# Patient Record
Sex: Female | Born: 1995 | Race: Black or African American | Hispanic: No | Marital: Single | Smoking: Never smoker
Health system: Southern US, Community
[De-identification: ages and names within clinical notes are randomized; demographics above are authoritative.]

## PROBLEM LIST (undated history)

## (undated) ENCOUNTER — Inpatient Hospital Stay: Payer: Self-pay

## (undated) DIAGNOSIS — L309 Dermatitis, unspecified: Secondary | ICD-10-CM

---
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
## 2017-03-19 ENCOUNTER — Emergency Department
Admission: EM | Admit: 2017-03-19 | Discharge: 2017-03-19 | Disposition: A | Discharge status: Home / Self Care | Payer: Medicaid Other | Attending: Emergency Medicine | Admitting: Emergency Medicine

## 2017-03-19 DIAGNOSIS — J0110 Acute frontal sinusitis, unspecified: Secondary | ICD-10-CM | POA: Diagnosis not present

## 2017-03-19 DIAGNOSIS — R0981 Nasal congestion: Secondary | ICD-10-CM | POA: Diagnosis present

## 2017-03-19 MED ORDER — AMOXICILLIN-POT CLAVULANATE 875-125 MG PO TABS: 1.0000 | ORAL_TABLET | Freq: Two times a day (BID) | ORAL | 0 refills | Status: DC

## 2017-03-19 MED ORDER — IBUPROFEN 800 MG PO TABS
800.0000 mg | ORAL_TABLET | Freq: Once | ORAL | Status: AC
  Administered 2017-03-19: 800 mg via ORAL

## 2017-03-19 MED ORDER — CETIRIZINE HCL 10 MG PO TABS: 10.0000 mg | ORAL_TABLET | Freq: Every day | ORAL | 0 refills | Status: DC

## 2017-03-19 MED ORDER — IBUPROFEN 800 MG PO TABS
ORAL_TABLET | ORAL | Status: AC
  Administered 2017-03-19: 800 mg via ORAL
  Filled 2017-03-19: qty 1

## 2017-03-19 MED ORDER — FLUTICASONE PROPIONATE 50 MCG/ACT NA SUSP: 1.0000 | Freq: Two times a day (BID) | NASAL | 0 refills | Status: DC

## 2017-03-19 NOTE — ED Provider Notes (Signed)
[facility] Emergency Department Provider Note  ____________________________________________  Time seen: Approximately 10:51 PM  I have reviewed the triage vital signs and the nursing notes.   HISTORY  Chief Complaint Facial Pain and Headache    HPI Danielle Gibbs is a 21 y.o. female who presents emergency department complaining of sinus congestion, sinus pressure, frontal headache. Patient reports that she has problems with allergies but does not take any allergy medicine. Patient reports that these symptoms have been ongoing for the past 4 days. She is taken one aspirin which has alleviated symptoms somewhat but returned. Patient denies any visual changes, neck pain, fevers or chills, abdominal pain, chest pain, shortness of breath.   Past Medical History:  Diagnosis Date  . Eczema     There are no active problems to display for this patient.   No past surgical history on file.  Prior to Admission medications   Medication Sig Start Date End Date Taking? Authorizing Provider  amoxicillin-clavulanate (AUGMENTIN) 875-125 MG tablet Take 1 tablet by mouth 2 (two) times daily. 03/19/17   Delorise Royals Madyn Ivins, PA-C  butalbital-acetaminophen-caffeine (FIORICET, ESGIC) [phone] MG tablet Take 1-2 tablets by mouth every 6 (six) hours as needed for headache. 09/16/16   Evangeline Dakin, PA-C  cetirizine (ZYRTEC) 10 MG tablet Take 1 tablet (10 mg total) by mouth daily. 03/19/17   Delorise Royals Kemal Amores, PA-C  fluticasone (FLONASE) 50 MCG/ACT nasal spray Place 1 spray into both nostrils 2 (two) times daily. 03/19/17   Delorise Royals Raiden Haydu, PA-C  hydrOXYzine (ATARAX/VISTARIL) 10 MG tablet Take 1 tablet (10 mg total) by mouth 3 (three) times daily as needed for anxiety. 09/16/16   Charmayne Sheer Beers, PA-C  triamcinolone ointment (KENALOG) 0.1 % Apply 1 application topically 2 (two) times daily.    Historical Provider, MD    Allergies Patient has no known allergies.  No family  history on file.  Social History Social History  Substance Use Topics  . Smoking status: Never Smoker  . Smokeless tobacco: Not on file  . Alcohol use No     Review of Systems  Constitutional: No fever/chills Eyes: No visual changes. No discharge ENT: For nasal congestion and sinus pressure Cardiovascular: no chest pain. Respiratory: no cough. No SOB. Gastrointestinal: No abdominal pain.  No nausea, no vomiting.  No diarrhea.  No constipation. Musculoskeletal: Negative for musculoskeletal pain. Skin: Negative for rash, abrasions, lacerations, ecchymosis. Neurological: For "sinus headache" but no focal weakness or numbness. 10-point ROS otherwise negative.  ____________________________________________   PHYSICAL EXAM:  VITAL SIGNS: ED Triage Vitals [03/19/17 2226]  Enc Vitals Group     BP (!) 132/91     Pulse Rate 78     Resp 18     Temp 98.8 F (37.1 C)     Temp Source Oral     SpO2 100 %     Weight 115 lb (52.2 kg)     Height 5' 6.5" (1.689 m)     Head Circumference      Peak Flow      Pain Score 5     Pain Loc      Pain Edu?      Excl. in GC?      Constitutional: Alert and oriented. Well appearing and in no acute distress. Eyes: Conjunctivae are normal. PERRL. EOMI. Head: Atraumatic. ENT:      Ears: EACs and TMs are unremarkable bilaterally      Nose: Moderate nasal congestion. Turbinates are erythematous and  edematous. Patient is tender to percussion over the frontal sinuses greater on left than right.      Mouth/Throat: Mucous membranes are moist.  Neck: No stridor. Neck is supple with full range of motion Hematological/Lymphatic/Immunilogical: nontender, mobile, anterior cervical lymphadenopathy. Cardiovascular: Normal rate, regular rhythm. Normal S1 and S2.  Good peripheral circulation. Respiratory: Normal respiratory effort without tachypnea or retractions. Lungs CTAB. Good air entry to the bases with no decreased or absent breath  sounds. Musculoskeletal: Full range of motion to all extremities. No gross deformities appreciated. Neurologic:  Normal speech and language. No gross focal neurologic deficits are appreciated.  Skin:  Skin is warm, dry and intact. No rash noted. Psychiatric: Mood and affect are normal. Speech and behavior are normal. Patient exhibits appropriate insight and judgement.   ____________________________________________   LABS (all labs ordered are listed, but only abnormal results are displayed)  Labs Reviewed - No data to display ____________________________________________  EKG   ____________________________________________  RADIOLOGY  No results found.  ____________________________________________    PROCEDURES  Procedure(s) performed:    Procedures    Medications  ibuprofen (ADVIL,MOTRIN) tablet 800 mg (not administered)  ibuprofen (ADVIL,MOTRIN) 800 MG tablet (not administered)     ____________________________________________   INITIAL IMPRESSION / ASSESSMENT AND PLAN / ED COURSE  Pertinent labs & imaging results that were available during my care of the patient were reviewed by me and considered in my medical decision making (see chart for details).  Review of the [place] CSRS was performed in accordance of the [facility] prior to dispensing any controlled drugs.     Patient's diagnosis is consistent with acute frontal sinusitis.. Patient will be discharged home with prescriptions for Flonase, Zyrtec, Augmentin . Patient is to follow up with primary care as needed or otherwise directed. Patient is given ED precautions to return to the ED for any worsening or new symptoms.     ____________________________________________  FINAL CLINICAL IMPRESSION(S) / ED DIAGNOSES  Final diagnoses:  Acute non-recurrent frontal sinusitis      NEW MEDICATIONS STARTED DURING THIS VISIT:  New Prescriptions   AMOXICILLIN-CLAVULANATE (AUGMENTIN) 875-125 MG TABLET    Take 1  tablet by mouth 2 (two) times daily.   CETIRIZINE (ZYRTEC) 10 MG TABLET    Take 1 tablet (10 mg total) by mouth daily.   FLUTICASONE (FLONASE) 50 MCG/ACT NASAL SPRAY    Place 1 spray into both nostrils 2 (two) times daily.        This chart was dictated using voice recognition software/Dragon. Despite best efforts to proofread, errors can occur which can change the meaning. Any change was purely unintentional.    Racheal Patches, PA-C 03/19/17 1610    Merrily Brittle, MD 03/19/17 [phone]

## 2017-03-19 NOTE — ED Triage Notes (Signed)
Patient reports having a "sinus headache" and allergies acting up. For several days.

## 2017-03-19 NOTE — ED Notes (Signed)
Pt reports dinus headache.

## 2017-08-17 ENCOUNTER — Inpatient Hospital Stay
Admission: EM | Admit: 2017-08-17 | Discharge: 2017-08-17 | Disposition: A | Discharge status: Home / Self Care | Payer: Medicaid Other | Attending: Obstetrics and Gynecology | Admitting: Obstetrics and Gynecology

## 2017-08-17 ENCOUNTER — Inpatient Hospital Stay: Payer: Medicaid Other

## 2017-08-17 ENCOUNTER — Encounter: Payer: Self-pay | Admitting: *Deleted

## 2017-08-17 DIAGNOSIS — O0932 Supervision of pregnancy with insufficient antenatal care, second trimester: Secondary | ICD-10-CM

## 2017-08-17 DIAGNOSIS — O26892 Other specified pregnancy related conditions, second trimester: Secondary | ICD-10-CM | POA: Diagnosis not present

## 2017-08-17 DIAGNOSIS — Z3A20 20 weeks gestation of pregnancy: Secondary | ICD-10-CM | POA: Insufficient documentation

## 2017-08-17 DIAGNOSIS — R109 Unspecified abdominal pain: Secondary | ICD-10-CM | POA: Diagnosis present

## 2017-08-17 DIAGNOSIS — O209 Hemorrhage in early pregnancy, unspecified: Secondary | ICD-10-CM | POA: Diagnosis not present

## 2017-08-17 DIAGNOSIS — O26899 Other specified pregnancy related conditions, unspecified trimester: Secondary | ICD-10-CM

## 2017-08-17 DIAGNOSIS — N939 Abnormal uterine and vaginal bleeding, unspecified: Secondary | ICD-10-CM

## 2017-08-17 DIAGNOSIS — R198 Other specified symptoms and signs involving the digestive system and abdomen: Secondary | ICD-10-CM

## 2017-08-17 DIAGNOSIS — L309 Dermatitis, unspecified: Secondary | ICD-10-CM | POA: Insufficient documentation

## 2017-08-17 DIAGNOSIS — O9989 Other specified diseases and conditions complicating pregnancy, childbirth and the puerperium: Secondary | ICD-10-CM | POA: Diagnosis not present

## 2017-08-17 DIAGNOSIS — Z3687 Encounter for antenatal screening for uncertain dates: Secondary | ICD-10-CM

## 2017-08-17 LAB — DIFFERENTIAL
Basophils Absolute: 0 10*3/uL (ref 0–0.1)
Basophils Relative: 1 %
EOS PCT: 1 %
Eosinophils Absolute: 0.1 10*3/uL (ref 0–0.7)
LYMPHS ABS: 1.5 10*3/uL (ref 1.0–3.6)
LYMPHS PCT: 18 %
MONO ABS: 0.5 10*3/uL (ref 0.2–0.9)
Monocytes Relative: 6 %
NEUTROS ABS: 6.1 10*3/uL (ref 1.4–6.5)
Neutrophils Relative %: 74 %

## 2017-08-17 LAB — URINALYSIS, COMPLETE (UACMP) WITH MICROSCOPIC
Bacteria, UA: NONE SEEN
Bilirubin Urine: NEGATIVE
GLUCOSE, UA: NEGATIVE mg/dL
HGB URINE DIPSTICK: NEGATIVE
Ketones, ur: NEGATIVE mg/dL
LEUKOCYTES UA: NEGATIVE
Nitrite: NEGATIVE
PH: 6 (ref 5.0–8.0)
Protein, ur: 30 mg/dL — AB
Specific Gravity, Urine: 1.025 (ref 1.005–1.030)

## 2017-08-17 LAB — ABO/RH: ABO/RH(D): A POS

## 2017-08-17 LAB — CBC
HEMATOCRIT: 30.9 % — AB (ref 35.0–47.0)
HEMATOCRIT: 29.2 % — AB (ref 35.0–47.0)
HEMOGLOBIN: 10.6 g/dL — AB (ref 12.0–16.0)
Hemoglobin: 11.2 g/dL — ABNORMAL LOW (ref 12.0–16.0)
MCH: 33.6 pg (ref 26.0–34.0)
MCH: 33.6 pg (ref 26.0–34.0)
MCHC: 36.1 g/dL — AB (ref 32.0–36.0)
MCHC: 36.1 g/dL — ABNORMAL HIGH (ref 32.0–36.0)
MCV: 93 fL (ref 80.0–100.0)
MCV: 93.1 fL (ref 80.0–100.0)
Platelets: 303 10*3/uL (ref 150–440)
Platelets: 268 10*3/uL (ref 150–440)
RBC: 3.32 MIL/uL — ABNORMAL LOW (ref 3.80–5.20)
RBC: 3.14 MIL/uL — AB (ref 3.80–5.20)
RDW: 13.9 % (ref 11.5–14.5)
RDW: 13.7 % (ref 11.5–14.5)
WBC: 8.3 10*3/uL (ref 3.6–11.0)
WBC: 8.2 10*3/uL (ref 3.6–11.0)

## 2017-08-17 LAB — COMPREHENSIVE METABOLIC PANEL
ALBUMIN: 3.5 g/dL (ref 3.5–5.0)
ALT: 11 U/L — ABNORMAL LOW (ref 14–54)
AST: 21 U/L (ref 15–41)
Alkaline Phosphatase: 52 U/L (ref 38–126)
Anion gap: 7 (ref 5–15)
BUN: 8 mg/dL (ref 6–20)
CHLORIDE: 107 mmol/L (ref 101–111)
CO2: 23 mmol/L (ref 22–32)
Calcium: 9.2 mg/dL (ref 8.9–10.3)
Creatinine, Ser: 0.46 mg/dL (ref 0.44–1.00)
GFR calc Af Amer: >60 mL/min (ref >60)
GFR calc non Af Amer: >60 mL/min (ref >60)
GLUCOSE: 77 mg/dL (ref 65–99)
POTASSIUM: 3.3 mmol/L — AB (ref 3.5–5.1)
Sodium: 137 mmol/L (ref 135–145)
Total Bilirubin: 0.8 mg/dL (ref 0.3–1.2)
Total Protein: 7.3 g/dL (ref 6.5–8.1)

## 2017-08-17 LAB — LIPASE, BLOOD: Lipase: 22 U/L (ref 11–51)

## 2017-08-17 LAB — CHLAMYDIA/NGC RT PCR (ARMC ONLY)
CHLAMYDIA TR: DETECTED — AB
N gonorrhoeae: NOT DETECTED

## 2017-08-17 LAB — POCT PREGNANCY, URINE: Preg Test, Ur: POSITIVE — AB

## 2017-08-17 MED ORDER — AZITHROMYCIN 250 MG PO TABS: 500.0000 mg | ORAL_TABLET | Freq: Every day | ORAL | Status: DC

## 2017-08-17 MED ORDER — AZITHROMYCIN 500 MG PO TABS: ORAL_TABLET | ORAL | 0 refills | Status: DC

## 2017-08-17 NOTE — H&P (Signed)
Danielle Gibbs is a 21 y.o. female presenting for Abd pain and found out she is pregnant today. Pt has felt FM at intervals.  OB History    Gravida Para Term Preterm AB Living   1             SAB TAB Ectopic Multiple Live Births                 Past Medical History:  Diagnosis Date  . Eczema    History reviewed. No pertinent surgical history. Family History: family history is not on file. Social History:  reports that she has never smoked. She has never used smokeless tobacco. She reports that she does not drink alcohol or use drugs.     Maternal Diabetes: No labs to date Genetic Screening: To late Maternal Ultrasounds/Referrals: N/A Fetal Ultrasounds or other Referrals:  N/A Maternal Substance Abuse:  None Significant Maternal Medications:  none Significant Maternal Lab Results:  none Other Comments:    Review of Systems  Constitutional: Negative.   HENT: Negative.   Eyes: Negative.   Respiratory: Negative.   Cardiovascular: Negative.   Gastrointestinal: Negative.   Genitourinary: Negative.   Musculoskeletal: Negative.   Skin: Negative.   Neurological: Negative.   Endo/Heme/Allergies: Negative.   Psychiatric/Behavioral: Negative.    History   Blood pressure 126/75, pulse 98, temperature 98.5 F (36.9 C), temperature source Oral, resp. rate 16, height 5\' 7"  (1.702 m), weight 54.4 kg (120 lb), last menstrual period 07/21/2017, SpO2 100 %, unknown if currently breastfeeding. Exam Physical Exam  Prenatal labs: ABO, Rh: --/--/A POS (08/29 1628) Antibody:   Rubella:   RPR:    HBsAg:    HIV:    GBS:     Assessment/Plan: A:Suspected 20 week pregnancy 2. Vaginal bleeding intermittantly  P: Type and RH. Will give Rhogam if indicated. 2. Prenatal labs and arrange NOB care. 3. DC after pain resolves and labs done.     Sharee PimpleCaron W Jones 08/17/2017, 6:17 PM

## 2017-08-17 NOTE — ED Provider Notes (Signed)
[facility] Emergency Department Provider Note  ____________________________________________  Time seen: Approximately 4:25 PM  I have reviewed the triage vital signs and the nursing notes.   HISTORY  Chief Complaint Abdominal Pain   HPI Shamaya K Gosser is a 21 y.o. female no significant past medical history who presents for evaluation of abdominal pain. Patient reports on and off pain for 3 months. This episode started 2 days ago. She describes a dull constant pain located in the center of her abdomen and the left lower quadrant and radiating to her lower back. She has been having intermittent vaginal spotting for the last few weeks. Patient reports that she has taken several pregnancy tests at home and they're all negative. She has noted that her abdomen is distended. No fever, chills, nausea, vomiting, dysuria or hematuria. No prior abdominal surgeries. Pain is moderate in intensity.  Past Medical History:  Diagnosis Date  . Eczema     There are no active problems to display for this patient.   No past surgical history on file.  Prior to Admission medications   Medication Sig Start Date End Date Taking? Authorizing Provider  amoxicillin-clavulanate (AUGMENTIN) 875-125 MG tablet Take 1 tablet by mouth 2 (two) times daily. 03/19/17   Cuthriell, Delorise Royals, PA-C  butalbital-acetaminophen-caffeine (FIORICET, ESGIC) [phone] MG tablet Take 1-2 tablets by mouth every 6 (six) hours as needed for headache. 09/16/16   Beers, Charmayne Sheer, PA-C  cetirizine (ZYRTEC) 10 MG tablet Take 1 tablet (10 mg total) by mouth daily. 03/19/17   Cuthriell, Delorise Royals, PA-C  fluticasone (FLONASE) 50 MCG/ACT nasal spray Place 1 spray into both nostrils 2 (two) times daily. 03/19/17   Cuthriell, Delorise Royals, PA-C  hydrOXYzine (ATARAX/VISTARIL) 10 MG tablet Take 1 tablet (10 mg total) by mouth 3 (three) times daily as needed for anxiety. 09/16/16   Beers, Charmayne Sheer, PA-C  triamcinolone  ointment (KENALOG) 0.1 % Apply 1 application topically 2 (two) times daily.    [provider]    Allergies Patient has no known allergies.  No family history on file.  Social History Social History  Substance Use Topics  . Smoking status: Never Smoker  . Smokeless tobacco: Never Used  . Alcohol use No    Review of Systems  Constitutional: Negative for fever. Eyes: Negative for visual changes. ENT: Negative for sore throat. Neck: No neck pain  Cardiovascular: Negative for chest pain. Respiratory: Negative for shortness of breath. Gastrointestinal: + lower abdominal pain. No vomiting or diarrhea. Genitourinary: Negative for dysuria. + vaginal spotting Musculoskeletal: Negative for back pain. Skin: Negative for rash. Neurological: Negative for headaches, weakness or numbness. Psych: No SI or HI  ____________________________________________   PHYSICAL EXAM:  VITAL SIGNS: ED Triage Vitals  Enc Vitals Group     BP 08/17/17 1550 120/84     Pulse Rate 08/17/17 1550 98     Resp 08/17/17 1550 18     Temp 08/17/17 1550 99.1 F (37.3 C)     Temp Source 08/17/17 1550 Oral     SpO2 08/17/17 1550 99 %     Weight 08/17/17 1547 120 lb (54.4 kg)     Height 08/17/17 1547 5\' 7"  (1.702 m)     Head Circumference --      Peak Flow --      Pain Score 08/17/17 1547 5     Pain Loc --      Pain Edu? --      Excl. in GC? --  Constitutional: Alert and oriented. Well appearing and in no apparent distress. HEENT:      Head: Normocephalic and atraumatic.         Eyes: Conjunctivae are normal. Sclera is non-icteric.       Mouth/Throat: Mucous membranes are moist.       Neck: Supple with no signs of meningismus. Cardiovascular: Regular rate and rhythm. No murmurs, gallops, or rubs. 2+ symmetrical distal pulses are present in all extremities. No JVD. Respiratory: Normal respiratory effort. Lungs are clear to auscultation bilaterally. No wheezes, crackles, or rhonchi.    Gastrointestinal: Gravid with uterus palpable at the level of the umbilicus Musculoskeletal: Nontender with normal range of motion in all extremities. No edema, cyanosis, or erythema of extremities. Neurologic: Normal speech and language. Face is symmetric. Moving all extremities. No gross focal neurologic deficits are appreciated. Skin: Skin is warm, dry and intact. No rash noted. Psychiatric: Mood and affect are normal. Speech and behavior are normal.  ____________________________________________   LABS (all labs ordered are listed, but only abnormal results are displayed)  Labs Reviewed  COMPREHENSIVE METABOLIC PANEL - Abnormal; Notable for the following:       Result Value   Potassium 3.3 (*)    ALT 11 (*)    All other components within normal limits  CBC - Abnormal; Notable for the following:    RBC 3.32 (*)    Hemoglobin 11.2 (*)    HCT 30.9 (*)    MCHC 36.1 (*)    All other components within normal limits  URINALYSIS, COMPLETE (UACMP) WITH MICROSCOPIC - Abnormal; Notable for the following:    Color, Urine YELLOW (*)    APPearance CLEAR (*)    Protein, ur 30 (*)    Squamous Epithelial / LPF 0-5 (*)    All other components within normal limits  POCT PREGNANCY, URINE - Abnormal; Notable for the following:    Preg Test, Ur POSITIVE (*)    All other components within normal limits  LIPASE, BLOOD  POC URINE PREG, ED  ABO/RH   ____________________________________________  EKG  none ____________________________________________  RADIOLOGY  none  ____________________________________________   PROCEDURES  Procedure(s) performed:yes  Procedures   BEDSIDE US: Showing fully formed fetus, size late 2nd early 3rd trimester, with good fetal movement and FHR 150 Critical Care performed:  None ____________________________________________   INITIAL IMPRESSION / ASSESSMENT AND PLAN / ED COURSE  21 y.o. female no significant past medical history who presents for  evaluation of abdominal pain. Patient found to have advanced pregnancy in the ED. Spoke with Dr. Feliberto GottronSchermerhorn, OBGYN on call and patient will be sent to L&D to be monitored for active labor.     Pertinent labs & imaging results that were available during my care of the patient were reviewed by me and considered in my medical decision making (see chart for details).    ____________________________________________   FINAL CLINICAL IMPRESSION(S) / ED DIAGNOSES  Final diagnoses:  Abdominal pain during pregnancy, antepartum      NEW MEDICATIONS STARTED DURING THIS VISIT:  New Prescriptions   No medications on file     Note:  This document was prepared using Dragon voice recognition software and may include unintentional dictation errors.    Don PerkingVeronese, [place], MD 08/17/17 [phone]

## 2017-08-17 NOTE — ED Triage Notes (Signed)
Pt has left lower abd pain with intermittent vag bleeding.  No bleeding now   No urinary sx.  Pt has low back pain.  Pt reports vag discharge.  Pt alert.

## 2017-08-17 NOTE — H&P (Signed)
Danielle Gibbs is a 21 y.o. female presenting for abd pain to the ER and did not know she was pregnant. Pt had US which dated her at 5920 5/7 weeks today and EDD of 01/02/17. Pt admits she had felt FM but, was in denial. NO PNC to date. [place] lives in [place].  OB History    Gravida Para Term Preterm AB Living   1             SAB TAB Ectopic Multiple Live Births                 Past Medical History:  Diagnosis Date  . Eczema    History reviewed. No pertinent surgical history. Family History: family history is not on file. Social History:  reports that she has never smoked. She has never used smokeless tobacco. She reports that she does not drink alcohol or use drugs.     Maternal Diabetes: Not tested Genetic Screening: Not done Maternal Ultrasounds/Referrals: 1st US today for dating Fetal Ultrasounds or other Referrals: N/A Maternal Substance Abuse:  N/A Significant Maternal Medications: None Significant Maternal Lab Results:  A pos since pt had some bleeding at intervals in the preg Other Comments: Pt conversing and stable and feel she is able to be discharged and advised [facility] [place].   Review of Systems  Constitutional: Negative.   HENT: Negative.   Eyes: Negative.   Respiratory: Negative.   Cardiovascular: Negative.   Gastrointestinal: Negative.   Genitourinary: Negative.   Musculoskeletal: Negative.   Skin: Negative.   Neurological: Negative.   Endo/Heme/Allergies: Negative.   Psychiatric/Behavioral: Negative.    History   Blood pressure 120/70, pulse 86, temperature 98.3 F (36.8 C), temperature source Oral, resp. rate 16, height 5\' 7"  (1.702 m), weight 54.4 kg (120 lb), last menstrual period 07/21/2017, SpO2 100 %, unknown if currently breastfeeding. Exam Physical Exam  Prenatal labs: ABO, Rh: --/--/A POS (08/29 1628) Antibody:  drawn today Rubella:  drawn today RPR:   drawn today HBsAg:   drawn today HIV:   drawn today GBS:    Not done  GC:neg Chlamydia: positive after pt dc'd home. Assessment/Plan: A:1. IUP at 20 5/7 weeks 2. Pos Chlamydia resulted after dc home (will call and prescribe Azithromax) Partner needs testing.  P:1. [facility] [place] [facility] [place] [facility] 2. RN to track pt and advise of need for Azithromycin. 3. DC home  Sharee PimpleCaron W Jones 08/17/2017, 10:52 PM

## 2017-08-17 NOTE — Progress Notes (Signed)
Pt educated regarding need for follow-up prenatal care and to take prenatal vitamin. Discharge instructions reviewed, information provided regarding fetal growth and development and self-care during pregnancy. All questions and concerns addressed with pt, pt stated understanding. Pt ambulated self with escort to ER for discharge.

## 2017-08-17 NOTE — OB Triage Note (Signed)
Arrived to L&D from [facility]-ED with complaints of lower abdominal pain. Bedside ultrasound performed in ED confirming intrauterine pregnancy. Pt previously unaware of pregnancy. No leaking of fluids, but complains of occasional vaginal spotting.

## 2017-08-17 NOTE — Discharge Summary (Signed)
Obstetric Discharge Summary Reason for Admission: abd pain seen in  the ER today, [facility] Prenatal Procedures:None Intrapartum Procedures: N/A Postpartum Procedures: Not delivered  Complications-Operative and Postpartum: N/A Hemoglobin  Date Value Ref Range Status  08/17/2017 11.2 (L) 12.0 - 16.0 g/dL Final   HCT  Date Value Ref Range Status  08/17/2017 30.9 (L) 35.0 - 47.0 % Final    Physical Exam:  General: A,A&O x3 Heart: S1S2, RRR, No M/R/G Lungs:CTA bilat no W/R/R Uterus: FH is approx 20 cms  Incision: N/A DVT Evaluation: Neg Homans  Discharge Diagnoses: Discharge home  Discharge Information: Date: 08/17/2017 Activity: Up abd lib Diet: REgular diet Medications: PNV Condition:Stable  Instructions: Home to rest, FU for nOB care ASAP Discharge to: Home [place] in [place] and [facility] ADvised to start on PNV 1 po qd. Needs PNC ASAP. Pt did not know she was pregnant but, admits she was in denial. After pt left, the Chlamydia came in resulting as positive. Pt left before it was back. Will have nursing locate pt and it is prescribed.  Newborn Data: This patient has no babies on file. Danielle Gibbs.  Danielle Gibbs 08/17/2017, 6:21 PM

## 2017-08-17 NOTE — Discharge Instructions (Signed)
How a Baby Grows During Pregnancy Pregnancy begins when a female's sperm enters a female's egg (fertilization). This happens in one of the tubes (fallopian tubes) that connect the ovaries to the womb (uterus). The fertilized egg is called an embryo until it reaches 10 weeks. From 10 weeks until birth, it is called a fetus. The fertilized egg moves down the fallopian tube to the uterus. Then it implants into the lining of the uterus and begins to grow. The developing fetus receives oxygen and nutrients through the pregnant woman's bloodstream and the tissues that grow (placenta) to support the fetus. The placenta is the life support system for the fetus. It provides nutrition and removes waste. Learning as much as you can about your pregnancy and how your baby is developing can help you enjoy the experience. It can also make you aware of when there might be a problem and when to ask questions. How long does a typical pregnancy last? A pregnancy usually lasts 280 days, or about 40 weeks. Pregnancy is divided into three trimesters:  First trimester: 0-13 weeks.  Second trimester: 14-27 weeks.  Third trimester: 28-40 weeks.  The day when your baby is considered ready to be born (full term) is your estimated date of delivery. How does my baby develop month by month? First month  The fertilized egg attaches to the inside of the uterus.  Some cells will form the placenta. Others will form the fetus.  The arms, legs, brain, spinal cord, lungs, and heart begin to develop.  At the end of the first month, the heart begins to beat.  Second month  The bones, inner ear, eyelids, hands, and feet form.  The genitals develop.  By the end of 8 weeks, all major organs are developing.  Third month  All of the internal organs are forming.  Teeth develop below the gums.  Bones and muscles begin to grow. The spine can flex.  The skin is transparent.  Fingernails and toenails begin to  form.  Arms and legs continue to grow longer, and hands and feet develop.  The fetus is about 3 in (7.6 cm) long.  Fourth month  The placenta is completely formed.  The external sex organs, neck, outer ear, eyebrows, eyelids, and fingernails are formed.  The fetus can hear, swallow, and move its arms and legs.  The kidneys begin to produce urine.  The skin is covered with a white waxy coating (vernix) and very fine hair (lanugo).  Fifth month  The fetus moves around more and can be felt for the first time (quickening).  The fetus starts to sleep and wake up and may begin to suck its finger.  The nails grow to the end of the fingers.  The organ in the digestive system that makes bile (gallbladder) functions and helps to digest the nutrients.  If your baby is a girl, eggs are present in her ovaries. If your baby is a boy, testicles start to move down into his scrotum.  Sixth month  The lungs are formed, but the fetus is not yet able to breathe.  The eyes open. The brain continues to develop.  Your baby has fingerprints and toe prints. Your baby's hair grows thicker.  At the end of the second trimester, the fetus is about 9 in (22.9 cm) long.  Seventh month  The fetus kicks and stretches.  The eyes are developed enough to sense changes in light.  The hands can make a grasping motion.  The fetus responds to sound.  Eighth month  All organs and body systems are fully developed and functioning.  Bones harden and taste buds develop. The fetus may hiccup.  Certain areas of the brain are still developing. The skull remains soft.  Ninth month  The fetus gains about  lb (0.23 kg) each week.  The lungs are fully developed.  Patterns of sleep develop.  The fetus's head typically moves into a head-down position (vertex) in the uterus to prepare for birth. If the buttocks move into a vertex position instead, the baby is breech.  The fetus weighs 6-9 lbs  (2.72-4.08 kg) and is 19-20 in (48.26-50.8 cm) long.  What can I do to have a healthy pregnancy and help my baby develop? Eating and Drinking  Eat a healthy diet. ? Talk with your health care provider to make sure that you are getting the nutrients that you and your baby need. ? Visit www.DisposableNylon.be to learn about creating a healthy diet.  Gain a healthy amount of weight during pregnancy as advised by your health care provider. This is usually 25-35 pounds. You may need to: ? Gain more if you were underweight before getting pregnant or if you are pregnant with more than one baby. ? Gain less if you were overweight or obese when you got pregnant.  Medicines and Vitamins  Take prenatal vitamins as directed by your health care provider. These include vitamins such as folic acid, iron, calcium, and vitamin D. They are important for healthy development.  Take medicines only as directed by your health care provider. Read labels and ask a pharmacist or your health care provider whether over-the-counter medicines, supplements, and prescription drugs are safe to take during pregnancy.  Activities  Be physically active as advised by your health care provider. Ask your health care provider to recommend activities that are safe for you to do, such as walking or swimming.  Do not participate in strenuous or extreme sports.  Lifestyle  Do not drink alcohol.  Do not use any tobacco products, including cigarettes, chewing tobacco, or electronic cigarettes. If you need help quitting, ask your health care provider.  Do not use illegal drugs.  Safety  Avoid exposure to mercury, lead, or other heavy metals. Ask your health care provider about common sources of these heavy metals.  Avoid listeria infection during pregnancy. Follow these precautions: ? Do not eat soft cheeses or deli meats. ? Do not eat hot dogs unless they have been warmed up to the point of steaming, such as in the  microwave oven. ? Do not drink unpasteurized milk.  Avoid toxoplasmosis infection during pregnancy. Follow these precautions: ? Do not change your cat's litter box, if you have a cat. Ask someone else to do this for you. ? Wear gardening gloves while working in the yard.  General Instructions  Keep all follow-up visits as directed by your health care provider. This is important. This includes prenatal care and screening tests.  Manage any chronic health conditions. Work closely with your health care provider to keep conditions, such as diabetes, under control.  How do I know if my baby is developing well? At each prenatal visit, your health care provider will do several different tests to check on your health and keep track of your babys development. These include:  Fundal height. ? Your health care provider will measure your growing belly from top to bottom using a tape measure. ? Your health care provider will also feel your  belly to determine your baby's position.  Heartbeat. ? An ultrasound in the first trimester can confirm pregnancy and show a heartbeat, depending on how far along you are. ? Your health care provider will check your baby's heart rate at every prenatal visit. ? As you get closer to your delivery date, you may have regular fetal heart rate monitoring to make sure that your baby is not in distress.  Second trimester ultrasound. ? This ultrasound checks your baby's development. It also indicates your babys gender.  What should I do if I have concerns about my baby's development? Always talk with your health care provider about any concerns that you may have. This information is not intended to replace advice given to you by your health care provider. Make sure you discuss any questions you have with your health care provider. Document Released: 05/24/2008 Document Revised: 05/13/2016 Document Reviewed: 05/15/2014 Elsevier Interactive Patient Education  2018  [employer]. Prenatal Vitamin and Mineral Combinations (oral solid dosage forms) What is this medicine? PRENATAL VITAMIN AND MINERAL combinations are used before, during, and after pregnancy to help provide provide good nutrition. This medicine may be used for other purposes; ask your health care provider or pharmacist if you have questions. COMMON BRAND NAME(S): Active OB, Advanced Care Plus, Advanced NatalCare, Advanced-RF NatalCare, Aminate Fe, Anemagen OB, B-Nexa, BP FoliNatal Plus B, BP MultiNatal Plus, BP MultiNatal Plus Chewable, BP Prenate, Brainstrong, Bright Beginnings Prenatal, Cal-Nate, Calcium PNV, CareNatal DHA, CareNate 600, Cavan One Omega, Cavan Prenatal with EC Calcium, Cavan-Alpha, Cavan-EC SOD DHA, Cavan-Heme OB, Cavan-Heme Omega, Cenogen Ultra, Administrator, sports, CertaVite with Antioxidants, Choice-OB + DHA, Citracal Prenatal, Citracal Prenatal + DHA, CitraNatal 90 DHA, CitraNatal Assure, CitraNatal B-Calm, CitraNatal DHA, CitraNatal Harmony, CitraNatal Rx, Classic Prenatal, ComBi Rx, Complete Natal DHA, Complete-RF, CompleteNate, Concept DHA, Concept OB, CoreNate-DHA, Corvite FE with Quatrefolic, CRNatal DHA, Daily Vitamin, Docosavit, Duet, Duet Chewable, Duet DHA, Duet DHA 400, Duet DHA 430ec, Duet DHA Balanced, Duet DHA Complete, Duet DHA Complete Gluten Free, Duet DHA EC, Duet DHA Ferrazone, EC Omega-3, DuoVit DHA, Edge OB, Elite OB, Elite OB with DHA, Elite-OB 400, Extra-Virt Plus, Femecal OB, Femecal OB Plus DHA, Ferrocite Plus, Focalgin 90 DHA, Focalgin [place], [place], [place] DHA, [address] One, [address] 3, [place] One with [place], [place] OB, Folivane-EC Calcium DHA NF, Foltabs 90 Plus DHA, Foltabs Prenatal, Foltabs Prenatal Plus DHA, Gesticare, Gesticare DHA, Gesticare DHA Delayed-Release, HemeNatal OB, HemeNatal OB + DHA, Hemocyte Plus, HIP Prenatal, ICAR Prenatal Rx, iNatal Advance, iNatal GT, iNatal Ultra, Infanate Balance, Infanate DHA, Infanate Plus,  Kolnatal, Lactocal-F, Levomefolate PNV, MACNATAL CN DHA, Marnatal-F, Marnatal-F Plus, Materna, Maxinate, [place] Prenatal, Mission Prenatal F.A., Mission Prenatal H.P., Mom's Choice Rx, Multi-Nate 30, Multi-Nate 30 DHA, Multi-Nate DHA Extra, Multifol Plus, Multivitamin With Minerals, MyNatal OB Prenatal, NataCaps, NataChew, NataFolic-OB, Natafort, Natal-V RX, NatalCare CFe 60, NatalCare GlossTabs, NatalCare PIC, [employer], [employer], [employer], [employer] Three, NATALVIRT 90 DHA, NATALVIRT CA, NataTab CFe, NataTab FA, Natatab Rx, Natelle, Natelle C, Natelle One, [employer] with DHA, Natelle Prefer, Natelle-ez, Navatab + DHA, Neevo, Neevo DHA, Nestabs, Nestabs ABC, Nestabs CBF, Nestabs DHA, Nestabs FA, Nestabs Rx, New Advanced Formula Prenatal Z, Nexa Plus, Nexa Select, Niferex-PN, Niferex-PN Forte, Niva-Plus, NovaNatal, NovaStart, Nu-Natal, NutraCare, Nutri-Tab OB, Nutri-Tab OB + DHA, Nutrinate, NutriSpire, O-Cal F.A., O-Cal Prenatal, OB Choice, OB Complete, OB Complete 400, OB Complete One, OB Complete Petite, OB Complete Premier, OB Complete with DHA, OB-Natal One, Obstetrix EC Prenatal, Obstetrix-100, Obtrex, Obtrex DHA, One-A-Day  Women's, One-A-Day Women's Prenatal, OptiNate, Paire OB Tablet Plus DHA, PNV OB + DHA, PNV Prenatal, PNV Prenatal Plus Multivitamin, PNV Tabs 29-1, PNV-DHA, PNV-DHA + Docusate, PNV-DHA Plus, PNV-First, PNV-Iron, PNV-OB with DHA, PNV-Omega, PNV-Select, PNV-Total with DHA, PNV-VP-U, PR [place] 400, PR [place] 400ec, PR [address] 430, PR [address] 430ec, PR [place] 440ec, PreCare, PreferaOB, PreferaOB + DHA, PreferaOB One, Premesis Rx, [address], [address] Plus, [address], [address], [place], [place], Prenaissance 90 DHA, Prenaissance Balance, Prenaissance DHA, Prenaissance Harmony DHA, Prenaissance Next, Prenaissance Plus, Prenaissance Promise, PrenaPlus, Prenat with Quatrefolic, PreNata Multivitamin with Iron, Prenatabs CBF, Prenatabs FA, Prenatabs OBN, Prenatabs RX,  Prenatal, Prenatal 1 Plus 1, Prenatal 19, Prenatal AD, Prenatal Formula 3, Prenatal H, Prenatal Low Iron, Prenatal MR 58 Fe, Prenatal MTR with Selenium, Prenatal Multivitamin + DHA 2, Prenatal Optima Advance, Prenatal Plus, Prenatal Plus Iron, Prenatal Plus Low Iron, Prenatal Rx with Beta Carotene, Prenatal S, Prenatal U, Prenatal Vitamin, PreNatal Vitamins Plus, Prenate Advance, Prenate AM with Quatrefolic, Prenate DHA, Prenate Elite, Prenate Enhance with [employer], Prenate Essential, Prenate GT, Prenate Mini, PreNate Plus, Prenate Restore with Quatrefolic, [address], Prenate Ultra, Prenavite, Prenavite Protein, PreNexa, [employer] Inc, PrePLUS, PreQue, PreTAB, Previte Rx, PrimaCare, [employer], PrimaCare ONE, Provida OB, PruEt DHA, PruEt DHAec, PureFe OB Plus, PureFe Plus, PureVit DualFe Plus, RE DualVit OB, RE DualVit Plus, RE OB + DHA, RE OB 90 + DHA, RE Prenatal, RE PreVit + DHA, RE-Nata 29, RE-Nata 29 OB, Reaphrim, Renate, Renate DHA, Renate DHA Extra, REocyte Plus, Right Step, Rovin-Nv, Rovin-Nv DHA, Rulavite, Se-Care, Se-Care Conceive, Se-Care Gesture, Se-Natal 19, Se-Natal 19 Chewable, Se-Natal 90, Se-Natal ONE, Se-Plete DHA, Se-Tan DHA, Se-Tan Plus, Select-OB, Select-OB + DHA, SetonET, SetonET-EC DHA, StrongStart, StrongStart Chewable Tablet, Stuart One, Stuart Prenatal + DHA, Stuartnatal Plus 3, Tandem DHA, Tandem OB, Tandem Plus, Taron A Prenatal Pack with DHA, Taron EC Calcium DHA Pack, Taron Prenatal with DHA, Taron-C DHA, Taron-EC Cal, Taron-Prex Prenatal with DHA, Thera [place] Complete, Thera [place] Core Nutrition, Thera [place] Lactation Support, Thera [address] OvaVite, Thera NIKE, [place], Thrivite, TL-Care DHA, TL-Select, TL-Select DHA, Tri Rx, TriAdvance, Kohl's, TriCare Prenatal DHA ONE, Trifera OB, Trimesis Rx, Trinatal GT, Navy Rx 1, Trinate, TriStart DHA, Triveen-Duo DHA, Triveen-PRx RNF, Triveen-Ten, Trust [employer], UltimateCare Advantage, Economist Combo,  Economist ONE, UltimateCare ONE NF, Coca Cola, VemaVite-PRx 2, Vena-Bal DHA, Venatal-FA, Verotin-BY, Verotin-GR, Vinacal, Vinacal B, Vinatal Forte, Vinate 90, Vinate AZ, Vinate AZ Extra, Vinate C, Vinate Calcium, Vinate Care, Vinate DHA, Vinate GT, Vinate IC, Vinate II, Vinate III, Vinate M Low Iron, Vinate One, Vinate PN, Vinate Ultra, Virt-Advance, Virt-C DHA, Virt-Care One, Virt-Nate, Virt-PN, Virt-PN DHA, Virt-PN Plus, Virt-Select, Virt-Vite GT, VirtPrex, Vitafol PN, Vitafol Ultra, Vitafol-Nano Prenatal, Vitafol-OB, Vitafol-OB + DHA, Vitafol-OB and DHA, Vitafol-One, VitaMed MD Plus Rx, VitaNatal OB Plus DHA, vitaPearl Prenatal, VitaPhil, VitaPhil + DHA, VitaPhil + DHA 90, VitaPhil AiDE, VitaSpire, Viva CT, VIVA DHA, Vol-Tab Rx, VP CH Ultra, VP-CH-PNV, VP-GGR-B6 Prenatal, VP-HEME One, VP-PNV-DHA, Zatean-CH, Zatean-Pn, Zatean-Pn DHA, Zatean-Pn Plus, Zingiber What should I tell my health care provider before I take this medicine? They need to know if you have any of these conditions: -bleeding or clotting disorder -history of anemia of any type -other chronic health condition -an unusual or allergic reaction to vitamins, minerals, other medicines, foods, dyes, or preservatives How should I use this medicine? Take this medicine by mouth with a glass of water. You can take it with or without food. If it upsets your stomach, take it with food. Chewable prenatal  vitamin tablets may be chewed completely before swallowing. Follow the directions on the prescription label. The usual dose is taken once a day. Do not take your medicine more often than directed. Contact your pediatrician regarding the use of this medicine in children. Special care may be needed. This medicine is intended for females who are pregnant, breast-feeding, or may become pregnant. Overdosage: If you think you have taken too much of this medicine contact a poison control center or emergency room at once. NOTE: This medicine is only  for you. Do not share this medicine with others. What if I miss a dose? If you miss a dose, take it as soon as you can. If it is almost time for your next dose, take only that dose. Do not take double or extra doses. What may interact with this medicine? -alendronate -antacids -cefdinir -cefditoren -etidronate -fluoroquinolone antibiotics (examples: ciprofloxacin, gatifloxacin, levofloxacin) -ibandronate -levodopa -risedronate -tetracycline antibiotics (examples: doxycycline, minocycline, tetracycline) -thyroid hormones -warfarin This list may not describe all possible interactions. Give your health care provider a list of all the medicines, herbs, non-prescription drugs, or dietary supplements you use. Also tell them if you smoke, drink alcohol, or use illegal drugs. Some items may interact with your medicine. What should I watch for while using this medicine? See your health care professional for regular checks on your progress. Remember that vitamin and mineral supplements do not replace the need for good nutrition from a balanced diet. Stools commonly change color when vitamins and minerals are taken. Notify your health care professional if this change is alarming or accompanied by other symptoms, like abdominal pain. What side effects may I notice from receiving this medicine? Side effects that you should report to your doctor or health care professional as soon as possible: -allergic reaction such as skin rash or difficulty breathing -vomiting Side effects that usually do not require medical attention (report to your doctor or health care professional if they continue or are bothersome): -nausea -stomach upset This list may not describe all possible side effects. Call your doctor for medical advice about side effects. You may report side effects to FDA at 1-800-FDA-1088. Where should I keep my medicine? Keep out of the reach of children. Most vitamins and minerals should be stored  at controlled room temperature. Check your specific product directions. Protect from heat and moisture. Throw away any unused medicine after the expiration date. NOTE: This sheet is a summary. It may not cover all possible information. If you have questions about this medicine, talk to your doctor, pharmacist, or health care provider.  2018 Elsevier/Gold Standard (2015-07-03 09:02:38)

## 2017-08-18 NOTE — Progress Notes (Signed)
Patient ID: Danielle Gibbs, female   DOB: Dec 11, 1996, 21 y.o.   MRN: [id number] Pt called and aware that she needs tx for chlamydia and her partner also. She needs a TOC in 3 weeks and advised "No sex" till cured. [facility]. Sharee Pimplearon W. Jones, RN, CNM, FNP

## 2017-08-19 LAB — HEPATITIS B SURFACE ANTIGEN: Hepatitis B Surface Ag: NEGATIVE

## 2017-08-19 LAB — SYPHILIS: RPR W/REFLEX TO RPR TITER AND TREPONEMAL ANTIBODIES, TRADITIONAL SCREENING AND DIAGNOSIS ALGORITHM: RPR Ser Ql: NONREACTIVE

## 2017-08-19 LAB — HIV ANTIBODY (ROUTINE TESTING W REFLEX): HIV Screen 4th Generation wRfx: NONREACTIVE

## 2017-08-19 LAB — RUBELLA SCREEN: Rubella: 4.09 index (ref >0.99)

## 2017-09-08 DIAGNOSIS — D649 Anemia, unspecified: Secondary | ICD-10-CM | POA: Insufficient documentation

## 2017-09-27 ENCOUNTER — Emergency Department
Admission: EM | Admit: 2017-09-27 | Discharge: 2017-09-28 | Disposition: A | Discharge status: Home / Self Care | Payer: Medicaid Other | Attending: Emergency Medicine | Admitting: Emergency Medicine

## 2017-09-27 ENCOUNTER — Encounter: Payer: Self-pay | Admitting: Emergency Medicine

## 2017-09-27 DIAGNOSIS — Z5321 Procedure and treatment not carried out due to patient leaving prior to being seen by health care provider: Secondary | ICD-10-CM | POA: Insufficient documentation

## 2017-09-27 DIAGNOSIS — R0602 Shortness of breath: Secondary | ICD-10-CM | POA: Diagnosis not present

## 2017-09-27 DIAGNOSIS — R079 Chest pain, unspecified: Secondary | ICD-10-CM | POA: Insufficient documentation

## 2017-09-27 LAB — BASIC METABOLIC PANEL
ANION GAP: 5 (ref 5–15)
BUN: 10 mg/dL (ref 6–20)
CALCIUM: 8.9 mg/dL (ref 8.9–10.3)
CO2: 26 mmol/L (ref 22–32)
CREATININE: 0.57 mg/dL (ref 0.44–1.00)
Chloride: 107 mmol/L (ref 101–111)
GFR calc Af Amer: >60 mL/min (ref >60)
GLUCOSE: 79 mg/dL (ref 65–99)
Potassium: 3.7 mmol/L (ref 3.5–5.1)
Sodium: 138 mmol/L (ref 135–145)

## 2017-09-27 LAB — CBC
HCT: 30.3 % — ABNORMAL LOW (ref 35.0–47.0)
HEMOGLOBIN: 10.2 g/dL — AB (ref 12.0–16.0)
MCH: 32.2 pg (ref 26.0–34.0)
MCHC: 33.8 g/dL (ref 32.0–36.0)
MCV: 95.4 fL (ref 80.0–100.0)
PLATELETS: 239 10*3/uL (ref 150–440)
RBC: 3.18 MIL/uL — ABNORMAL LOW (ref 3.80–5.20)
RDW: 14.5 % (ref 11.5–14.5)
WBC: 10 10*3/uL (ref 3.6–11.0)

## 2017-09-27 LAB — TROPONIN I

## 2017-09-27 LAB — HCG, QUANTITATIVE, PREGNANCY: hCG, Beta Chain, Quant, S: 3707 m[IU]/mL — ABNORMAL HIGH (ref <5)

## 2017-09-27 NOTE — ED Triage Notes (Addendum)
Patient states that she is [redacted] weeks pregnant and started feeling short of breath and having left side chest pain. Patient denies any cardiac or lung problems. Patient speaking in complete sentences in no acute distress at this time. Patient also reports that she has had decrease fetal movement today.

## 2017-09-28 NOTE — ED Notes (Signed)
Father to desk to inquire over wait time; updated on such; father st leaving now due to wait time; informed exam room should be available soon as there is approx one patient in front but father st pt has class in the morning and leaving now

## 2018-08-16 ENCOUNTER — Encounter (HOSPITAL_COMMUNITY): Payer: Self-pay

## 2018-12-16 ENCOUNTER — Encounter: Payer: Self-pay | Admitting: Internal Medicine

## 2018-12-16 ENCOUNTER — Other Ambulatory Visit: Payer: Self-pay

## 2018-12-16 ENCOUNTER — Emergency Department
Admission: EM | Admit: 2018-12-16 | Discharge: 2018-12-16 | Disposition: A | Discharge status: Home / Self Care | Payer: Medicaid Other | Attending: Emergency Medicine | Admitting: Emergency Medicine

## 2018-12-16 DIAGNOSIS — R0981 Nasal congestion: Secondary | ICD-10-CM | POA: Diagnosis not present

## 2018-12-16 DIAGNOSIS — J111 Influenza due to unidentified influenza virus with other respiratory manifestations: Secondary | ICD-10-CM | POA: Insufficient documentation

## 2018-12-16 DIAGNOSIS — R05 Cough: Secondary | ICD-10-CM | POA: Diagnosis not present

## 2018-12-16 DIAGNOSIS — R0989 Other specified symptoms and signs involving the circulatory and respiratory systems: Secondary | ICD-10-CM | POA: Diagnosis present

## 2018-12-16 LAB — INFLUENZA PANEL BY PCR (TYPE A & B)
Influenza A By PCR: NEGATIVE
Influenza B By PCR: POSITIVE — AB

## 2018-12-16 MED ORDER — OSELTAMIVIR PHOSPHATE 75 MG PO CAPS: 75.0000 mg | ORAL_CAPSULE | Freq: Two times a day (BID) | ORAL | 0 refills | Status: AC

## 2018-12-16 MED ORDER — GUAIFENESIN-CODEINE 100-10 MG/5ML PO SOLN: 5.0000 mL | Freq: Three times a day (TID) | ORAL | 0 refills | Status: DC | PRN

## 2018-12-16 MED ORDER — OSELTAMIVIR PHOSPHATE 75 MG PO CAPS: 75.0000 mg | ORAL_CAPSULE | Freq: Two times a day (BID) | ORAL | 0 refills | Status: DC

## 2018-12-16 NOTE — ED Triage Notes (Signed)
"  I think I have the flu."  Reports coughing and "heat flashes".

## 2018-12-16 NOTE — ED Provider Notes (Signed)
Danielle Gibbs ____________________________________________  Time seen: 2055  I have reviewed the triage vital signs and the nursing notes.  HISTORY  Chief Complaint  Influenza   HPI Danielle Gibbs is a 22 y.o. female presents to the ER today with complaint of runny nose, nasal congestion and chills.  She reports this started yesterday.  She is blowing clear mucus out of her nose.  She denies sore throat, ear pain.  She does have a slight nonproductive cough.  She denies shortness of breath or wheezing.  She is not sure if she is run fevers but has had chills and body aches.  She has tried Tylenol and Aleve with minimal relief.  She has no history of allergies or asthma.  She has had sick contacts diagnosed with the flu.  She did not get her flu shot.  Past Medical History:  Diagnosis Date  . Eczema     There are no active problems to display for this patient.   History reviewed. No pertinent surgical history.  Prior to Admission medications   Medication Sig Start Date End Date Taking? Authorizing Provider  guaiFENesin-codeine 100-10 MG/5ML syrup Take 5 mLs by mouth 3 (three) times daily as needed for cough. 12/16/18   Lorre MunroeBaity, Regina W, NP  oseltamivir (TAMIFLU) 75 MG capsule Take 1 capsule (75 mg total) by mouth 2 (two) times daily for 5 days. 12/16/18 12/21/18  Lorre MunroeBaity, Regina W, NP    Allergies Patient has no known allergies.  No family history on file.  Social History Social History   Tobacco Use  . Smoking status: Never Smoker  . Smokeless tobacco: Never Used  Substance Use Topics  . Alcohol use: No  . Drug use: No    Review of Systems  Constitutional: Positive for chills and body aches.  Negative for fever.. ENT: Positive for runny nose and nasal congestion.  Negative for ear pain or sore throat. Cardiovascular: Negative for chest pain. Respiratory: Stiffer cough negative for shortness of  breath. Gastrointestinal: Negative for nausea, vomiting and diarrhea. Skin: Negative for rash.  ____________________________________________  PHYSICAL EXAM:  VITAL SIGNS: ED Triage Vitals  Enc Vitals Group     BP 12/16/18 2016 121/89     Pulse Rate 12/16/18 2016 (!) 113     Resp 12/16/18 2016 18     Temp 12/16/18 2016 99.6 F (37.6 C)     Temp Source 12/16/18 2016 Oral     SpO2 12/16/18 2016 100 %     Weight 12/16/18 2017 130 lb (59 kg)     Height 12/16/18 2017 5\' 7"  (1.702 m)     Head Circumference --      Peak Flow --      Pain Score 12/16/18 2017 0     Pain Loc --      Pain Edu? --      Excl. in GC? --     Constitutional: Alert and oriented.  Appearing but in no distress. Ears: Canals clear. TMs intact bilaterally. Nose: Mucosa pink and moist.  Clear discharge noted in bilateral nare Mouth/Throat: Mucous membranes are moist.  No tonsillar swelling, exudate or erythema noted. Hematological/Lymphatic/Immunological: No cervical lymphadenopathy. Cardiovascular: Tachycardic with regular rhythm.  Respiratory: Normal respiratory effort. No wheezes/rales/rhonchi. ____________________________________________    LABS  Lab Orders     Influenza panel by PCR (type A & B)  ____________________________________________   INITIAL IMPRESSION / ASSESSMENT AND PLAN / ED COURSE  Runny Nose, Nasal Congestion,  Cough:  DDX include influenza, viral uri with cough, allergies Influenza positive for B RX for Tamiflu 75 mg BID x 5 days Can take Tylenol or Ibuprofen as needed for fever, body aches RX for Robitussin AC for cough ____________________________________________  FINAL CLINICAL IMPRESSION(S) / ED DIAGNOSES  Final diagnoses:  Influenza   Nicki Reaperegina Baity, NP    Lorre MunroeBaity, Regina W, NP 12/16/18 2111    Phineas SemenGoodman, Graydon, MD 12/16/18 2156

## 2018-12-16 NOTE — Discharge Instructions (Addendum)
Been diagnosed with the flu.  I am given you a prescription for Tamiflu and cough syrup.  You can take Tylenol or ibuprofen as needed for fever and body aches.

## 2018-12-16 NOTE — ED Triage Notes (Signed)
FIRST NURSE NOTE-here for possible flu. Pt given mask. NAD. Unlabored.

## 2019-04-02 DIAGNOSIS — Z113 Encounter for screening for infections with a predominantly sexual mode of transmission: Secondary | ICD-10-CM | POA: Diagnosis not present

## 2019-04-02 DIAGNOSIS — R300 Dysuria: Secondary | ICD-10-CM | POA: Diagnosis not present

## 2019-04-04 DIAGNOSIS — Z363 Encounter for antenatal screening for malformations: Secondary | ICD-10-CM | POA: Diagnosis not present

## 2019-04-11 DIAGNOSIS — Z3A21 21 weeks gestation of pregnancy: Secondary | ICD-10-CM | POA: Diagnosis not present

## 2019-04-11 DIAGNOSIS — Z3482 Encounter for supervision of other normal pregnancy, second trimester: Secondary | ICD-10-CM | POA: Diagnosis not present

## 2019-04-26 DIAGNOSIS — Z3402 Encounter for supervision of normal first pregnancy, second trimester: Secondary | ICD-10-CM | POA: Diagnosis not present

## 2019-04-26 DIAGNOSIS — M549 Dorsalgia, unspecified: Secondary | ICD-10-CM | POA: Diagnosis not present

## 2019-04-26 DIAGNOSIS — O9989 Other specified diseases and conditions complicating pregnancy, childbirth and the puerperium: Secondary | ICD-10-CM | POA: Diagnosis not present

## 2019-05-22 DIAGNOSIS — Z23 Encounter for immunization: Secondary | ICD-10-CM | POA: Diagnosis not present

## 2019-05-22 DIAGNOSIS — Z3402 Encounter for supervision of normal first pregnancy, second trimester: Secondary | ICD-10-CM | POA: Diagnosis not present

## 2019-06-07 DIAGNOSIS — F419 Anxiety disorder, unspecified: Secondary | ICD-10-CM | POA: Diagnosis not present

## 2019-06-07 DIAGNOSIS — O99340 Other mental disorders complicating pregnancy, unspecified trimester: Secondary | ICD-10-CM | POA: Diagnosis not present

## 2019-06-11 DIAGNOSIS — F419 Anxiety disorder, unspecified: Secondary | ICD-10-CM | POA: Diagnosis not present

## 2019-06-11 DIAGNOSIS — F4001 Agoraphobia with panic disorder: Secondary | ICD-10-CM | POA: Diagnosis not present

## 2019-07-31 DIAGNOSIS — Z3483 Encounter for supervision of other normal pregnancy, third trimester: Secondary | ICD-10-CM | POA: Diagnosis not present

## 2019-08-09 DIAGNOSIS — O26893 Other specified pregnancy related conditions, third trimester: Secondary | ICD-10-CM | POA: Diagnosis not present

## 2019-08-09 DIAGNOSIS — Z3A38 38 weeks gestation of pregnancy: Secondary | ICD-10-CM | POA: Diagnosis not present

## 2019-08-11 DIAGNOSIS — Z3A39 39 weeks gestation of pregnancy: Secondary | ICD-10-CM | POA: Diagnosis not present

## 2019-08-14 DIAGNOSIS — Z3A39 39 weeks gestation of pregnancy: Secondary | ICD-10-CM | POA: Diagnosis not present

## 2019-08-14 DIAGNOSIS — Z8619 Personal history of other infectious and parasitic diseases: Secondary | ICD-10-CM | POA: Diagnosis not present

## 2019-08-14 DIAGNOSIS — Z1159 Encounter for screening for other viral diseases: Secondary | ICD-10-CM | POA: Diagnosis not present

## 2019-09-03 DIAGNOSIS — H538 Other visual disturbances: Secondary | ICD-10-CM | POA: Diagnosis not present

## 2019-09-03 DIAGNOSIS — H53149 Visual discomfort, unspecified: Secondary | ICD-10-CM | POA: Diagnosis not present

## 2019-09-03 DIAGNOSIS — O9989 Other specified diseases and conditions complicating pregnancy, childbirth and the puerperium: Secondary | ICD-10-CM | POA: Diagnosis not present

## 2019-09-03 DIAGNOSIS — R51 Headache: Secondary | ICD-10-CM | POA: Diagnosis not present

## 2019-09-26 ENCOUNTER — Encounter (HOSPITAL_COMMUNITY): Payer: Self-pay

## 2019-09-26 ENCOUNTER — Ambulatory Visit (HOSPITAL_COMMUNITY)
Admission: EM | Admit: 2019-09-26 | Discharge: 2019-09-26 | Disposition: A | Discharge status: Home / Self Care | Payer: Medicaid Other | Attending: Emergency Medicine | Admitting: Emergency Medicine

## 2019-09-26 ENCOUNTER — Other Ambulatory Visit: Payer: Self-pay

## 2019-09-26 DIAGNOSIS — L0291 Cutaneous abscess, unspecified: Secondary | ICD-10-CM | POA: Diagnosis not present

## 2019-09-26 MED ORDER — AMOXICILLIN 500 MG PO CAPS: 500.0000 mg | ORAL_CAPSULE | Freq: Three times a day (TID) | ORAL | 0 refills | Status: DC

## 2019-09-26 NOTE — ED Triage Notes (Signed)
Pt. States theres a medium sized bump under right armpit with pain and inflamed. She states she has no hx of boils.

## 2019-09-26 NOTE — ED Triage Notes (Signed)
Patient presents to Urgent Care with complaints of abscess under right axilla since 2 days ago. Patient reports it is not open or draining.

## 2019-09-26 NOTE — ED Provider Notes (Signed)
[facility]    CSN: [id number] Arrival date & time: 09/26/19  1926      History   Chief Complaint Chief Complaint  Patient presents with  . Abscess    2 DAYS    HPI Danielle Gibbs is a 23 y.o. female.   Patient presents with abscess in her right axilla x2 days.  No drainage.  She denies fever or chills.  She denies numbness, weakness, or paresthesias in RUE.  No pertinent medical history.  No treatments attempted at home.  The history is provided by the patient.    Past Medical History:  Diagnosis Date  . Eczema     There are no active problems to display for this patient.   History reviewed. No pertinent surgical history.  OB History    Gravida  1   Para      Term      Preterm      AB      Living        SAB      TAB      Ectopic      Multiple      Live Births               Home Medications    Prior to Admission medications   Medication Sig Start Date End Date Taking? Authorizing Provider  amoxicillin (AMOXIL) 500 MG capsule Take 1 capsule (500 mg total) by mouth 3 (three) times daily. 09/26/19   Mickie Bail, NP  guaiFENesin-codeine 100-10 MG/5ML syrup Take 5 mLs by mouth 3 (three) times daily as needed for cough. 12/16/18   Lorre Munroe, NP    Family History Family History  Problem Relation Age of Onset  . Healthy Mother   . Healthy Father     Social History Social History   Tobacco Use  . Smoking status: Never Smoker  . Smokeless tobacco: Never Used  Substance Use Topics  . Alcohol use: No  . Drug use: No     Allergies   Patient has no known allergies.   Review of Systems Review of Systems  Constitutional: Negative for chills and fever.  HENT: Negative for ear pain and sore throat.   Eyes: Negative for pain and visual disturbance.  Respiratory: Negative for cough and shortness of breath.   Cardiovascular: Negative for chest pain and palpitations.  Gastrointestinal: Negative for abdominal pain and  vomiting.  Genitourinary: Negative for dysuria and hematuria.  Musculoskeletal: Negative for arthralgias and back pain.  Skin: Positive for wound. Negative for color change and rash.  Neurological: Negative for seizures and syncope.  All other systems reviewed and are negative.    Physical Exam Triage Vital Signs ED Triage Vitals  Enc Vitals Group     BP 09/26/19 1936 126/79     Pulse Rate 09/26/19 1936 71     Resp 09/26/19 1936 17     Temp 09/26/19 1936 97.6 F (36.4 C)     Temp Source 09/26/19 1936 Temporal     SpO2 09/26/19 1936 100 %     Weight --      Height --      Head Circumference --      Peak Flow --      Pain Score 09/26/19 1934 8     Pain Loc --      Pain Edu? --      Excl. in GC? --    No data found.  Updated  Vital Signs BP 124/82 (BP Location: Left Arm)   Pulse 78   Temp 97.6 F (36.4 C) (Temporal)   Resp 17   SpO2 100%   Breastfeeding Yes   Visual Acuity Right Eye Distance:   Left Eye Distance:   Bilateral Distance:    Right Eye Near:   Left Eye Near:    Bilateral Near:     Physical Exam Vitals signs and nursing note reviewed.  Constitutional:      General: She is not in acute distress.    Appearance: She is well-developed. She is not ill-appearing.  HENT:     Head: Normocephalic and atraumatic.  Eyes:     Conjunctiva/sclera: Conjunctivae normal.  Neck:     Musculoskeletal: Neck supple.  Cardiovascular:     Rate and Rhythm: Normal rate and regular rhythm.     Heart sounds: No murmur.  Pulmonary:     Effort: Pulmonary effort is normal. No respiratory distress.     Breath sounds: Normal breath sounds.  Abdominal:     Palpations: Abdomen is soft.     Tenderness: There is no abdominal tenderness. There is no guarding or rebound.  Skin:    General: Skin is warm and dry.     Findings: No erythema.     Comments: 2 x 1 cm firm abscess in right axilla. No drainage or erythema.    Neurological:     General: No focal deficit present.      Mental Status: She is alert and oriented to person, place, and time.     Sensory: No sensory deficit.     Motor: No weakness.      UC Treatments / Results  Labs (all labs ordered are listed, but only abnormal results are displayed) Labs Reviewed - No data to display  EKG   Radiology No results found.  Procedures Procedures (including critical care time)  Medications Ordered in UC Medications - No data to display  Initial Impression / Assessment and Plan / UC Course  I have reviewed the triage vital signs and the nursing notes.  Pertinent labs & imaging results that were available during my care of the patient were reviewed by me and considered in my medical decision making (see chart for details).    Abscess in right axilla.  Treating with amoxicillin (patient is breast-feeding).  Instructed patient to apply warm compresses and encourage abscess to drain.  Instructed her to return here or follow-up with her PCP or go to the ED if she develops increased pain, redness, swelling, fever, chills, or other signs of systemic infection.  Patient agrees with plan of care.     Final Clinical Impressions(s) / UC Diagnoses   Final diagnoses:  Abscess     Discharge Instructions     Take the antibiotic as prescribed.  Apply warm compresses to the area 2-3 times a day.  Encourage the abscess to drain.    Return here or follow-up with your primary care provider or go to the emergency department if you develop increased pain, redness, swelling, fever, chills, or other signs of infection.        ED Prescriptions    Medication Sig Dispense Auth. Provider   amoxicillin (AMOXIL) 500 MG capsule Take 1 capsule (500 mg total) by mouth 3 (three) times daily. 21 capsule Sharion Balloon, NP     PDMP not reviewed this encounter.   Sharion Balloon, NP 09/26/19 2015

## 2019-09-26 NOTE — Discharge Instructions (Addendum)
Take the antibiotic as prescribed.  Apply warm compresses to the area 2-3 times a day.  Encourage the abscess to drain.    Return here or follow-up with your primary care provider or go to the emergency department if you develop increased pain, redness, swelling, fever, chills, or other signs of infection.

## 2019-12-01 ENCOUNTER — Ambulatory Visit (HOSPITAL_COMMUNITY)
Admission: EM | Admit: 2019-12-01 | Discharge: 2019-12-01 | Disposition: A | Discharge status: Home / Self Care | Payer: Medicaid Other | Attending: Family Medicine | Admitting: Family Medicine

## 2019-12-01 ENCOUNTER — Other Ambulatory Visit: Payer: Self-pay

## 2019-12-01 ENCOUNTER — Encounter (HOSPITAL_COMMUNITY): Payer: Self-pay

## 2019-12-01 DIAGNOSIS — R300 Dysuria: Secondary | ICD-10-CM

## 2019-12-01 DIAGNOSIS — Z3202 Encounter for pregnancy test, result negative: Secondary | ICD-10-CM | POA: Diagnosis not present

## 2019-12-01 LAB — POCT PREGNANCY, URINE: Preg Test, Ur: NEGATIVE

## 2019-12-01 LAB — POC URINE PREG, ED: Preg Test, Ur: NEGATIVE

## 2019-12-01 MED ORDER — FLUCONAZOLE 150 MG PO TABS: 150.0000 mg | ORAL_TABLET | Freq: Every day | ORAL | 0 refills | Status: AC

## 2019-12-01 MED ORDER — NITROFURANTOIN MONOHYD MACRO 100 MG PO CAPS: 100.0000 mg | ORAL_CAPSULE | Freq: Two times a day (BID) | ORAL | 0 refills | Status: DC

## 2019-12-01 NOTE — ED Triage Notes (Signed)
Pt presents with pain with urination and vaginal itching X 3 days.

## 2019-12-01 NOTE — ED Provider Notes (Signed)
[place]    CSN: [id number] Arrival date & time: 12/01/19  1104      History   Chief Complaint Chief Complaint  Patient presents with  . Urinary Tract Infection    HPI Danielle Gibbs is a 23 y.o. female.   Patient here c/w dysuria x 3 days.  Admits vaginal itching, denies fever, chills, nausea, vomiting, diarrhea, abdominal pain, flank pain, vaginal discharge, hematuria. LMP now.  Sexually active, 1 female partner, with condoms, no known exposures to STDs.  No recent antibiotics use.     Past Medical History:  Diagnosis Date  . Eczema     There are no problems to display for this patient.   History reviewed. No pertinent surgical history.  OB History    Gravida  1   Para      Term      Preterm      AB      Living        SAB      TAB      Ectopic      Multiple      Live Births               Home Medications    Prior to Admission medications   Medication Sig Start Date End Date Taking? Authorizing Provider  amoxicillin (AMOXIL) 500 MG capsule Take 1 capsule (500 mg total) by mouth 3 (three) times daily. 09/26/19   Sharion Balloon, NP  fluconazole (DIFLUCAN) 150 MG tablet Take 1 tablet (150 mg total) by mouth daily for 1 day. 12/01/19 12/02/19  Peri Jefferson, PA-C  guaiFENesin-codeine 100-10 MG/5ML syrup Take 5 mLs by mouth 3 (three) times daily as needed for cough. 12/16/18   Jearld Fenton, NP  nitrofurantoin, macrocrystal-monohydrate, (MACROBID) 100 MG capsule Take 1 capsule (100 mg total) by mouth 2 (two) times daily. 12/01/19   Peri Jefferson, PA-C    Family History Family History  Problem Relation Age of Onset  . Healthy Mother   . Healthy Father     Social History Social History   Tobacco Use  . Smoking status: Never Smoker  . Smokeless tobacco: Never Used  Substance Use Topics  . Alcohol use: No  . Drug use: No     Allergies   Patient has no known allergies.   Review of Systems Review of Systems   Constitutional: Negative for chills, fatigue and fever.  Gastrointestinal: Negative for abdominal pain, diarrhea, nausea and vomiting.  Genitourinary: Positive for dysuria. Negative for flank pain, frequency, hematuria, menstrual problem, pelvic pain, urgency, vaginal bleeding, vaginal discharge and vaginal pain.  Skin: Negative for color change and rash.  Hematological: Negative for adenopathy.  Psychiatric/Behavioral: Negative for sleep disturbance.     Physical Exam Triage Vital Signs ED Triage Vitals  Enc Vitals Group     BP 12/01/19 1124 115/78     Pulse Rate 12/01/19 1124 71     Resp 12/01/19 1124 18     Temp 12/01/19 1124 98.3 F (36.8 C)     Temp Source 12/01/19 1124 Oral     SpO2 12/01/19 1124 98 %     Weight --      Height --      Head Circumference --      Peak Flow --      Pain Score 12/01/19 1125 7     Pain Loc --      Pain Edu? --  Excl. in GC? --    No data found.  Updated Vital Signs BP 115/78 (BP Location: Left Arm)   Pulse 71   Temp 98.3 F (36.8 C) (Oral)   Resp 18   LMP 11/26/2019   SpO2 98%   Visual Acuity Right Eye Distance:   Left Eye Distance:   Bilateral Distance:    Right Eye Near:   Left Eye Near:    Bilateral Near:     Physical Exam Vitals and nursing note reviewed.  Constitutional:      General: She is not in acute distress.    Appearance: Normal appearance. She is not toxic-appearing.  HENT:     Head: Normocephalic and atraumatic.     Nose: Nose normal.  Eyes:     General: No scleral icterus.    Extraocular Movements: Extraocular movements intact.     Conjunctiva/sclera: Conjunctivae normal.     Pupils: Pupils are equal, round, and reactive to light.  Cardiovascular:     Rate and Rhythm: Normal rate and regular rhythm.     Heart sounds: No murmur.  Pulmonary:     Effort: Pulmonary effort is normal. No respiratory distress.     Breath sounds: Normal breath sounds.  Abdominal:     General: Abdomen is flat. Bowel  sounds are normal. There is no distension.     Palpations: Abdomen is soft.     Tenderness: There is no abdominal tenderness. There is no right CVA tenderness, left CVA tenderness, guarding or rebound.  Musculoskeletal:        General: No deformity. Normal range of motion.     Cervical back: Normal range of motion.  Skin:    General: Skin is warm.     Capillary Refill: Capillary refill takes less than 2 seconds.     Findings: No lesion.  Neurological:     General: No focal deficit present.     Mental Status: She is alert and oriented to person, place, and time.  Psychiatric:        Mood and Affect: Mood normal.        Behavior: Behavior normal.      UC Treatments / Results  Labs (all labs ordered are listed, but only abnormal results are displayed) Labs Reviewed  POC URINE PREG, ED  POCT PREGNANCY, URINE    EKG   Radiology No results found.  Procedures Procedures (including critical care time)  Medications Ordered in UC Medications - No data to display  Initial Impression / Assessment and Plan / UC Course  I have reviewed the triage vital signs and the nursing notes.  Pertinent labs & imaging results that were available during my care of the patient were reviewed by me and considered in my medical decision making (see chart for details).     UA 1+ blood, 1+ leuk, 1+ protein, otherwise normal. Patient declined STD testing and testing of yeast infection at this time. If no improvement follow up with PCP. Final Clinical Impressions(s) / UC Diagnoses   Final diagnoses:  Dysuria     Discharge Instructions     Take medication as prescribed. Take diflucan after day 5 or 6 of being on antibiotics.    ED Prescriptions    Medication Sig Dispense Auth. Provider   fluconazole (DIFLUCAN) 150 MG tablet Take 1 tablet (150 mg total) by mouth daily for 1 day. 1 tablet Evern CoreLindquist, Jameyah Fennewald, PA-C   nitrofurantoin, macrocrystal-monohydrate, (MACROBID) 100 MG capsule Take 1  capsule (100 mg total)  by mouth 2 (two) times daily. 14 capsule Evern Core, PA-C     PDMP not reviewed this encounter.   Evern Core, PA-C 12/01/19 1231

## 2019-12-01 NOTE — Discharge Instructions (Signed)
Take medication as prescribed. Take diflucan after day 5 or 6 of being on antibiotics.

## 2019-12-03 ENCOUNTER — Encounter (HOSPITAL_COMMUNITY): Payer: Self-pay

## 2019-12-03 ENCOUNTER — Other Ambulatory Visit: Payer: Self-pay

## 2019-12-03 ENCOUNTER — Inpatient Hospital Stay
Admission: RE | Admit: 2019-12-03 | Discharge: 2019-12-03 | Disposition: A | Discharge status: Home / Self Care | Payer: 59 | Source: Ambulatory Visit

## 2019-12-03 ENCOUNTER — Ambulatory Visit (HOSPITAL_COMMUNITY)
Admission: EM | Admit: 2019-12-03 | Discharge: 2019-12-03 | Disposition: A | Discharge status: Home / Self Care | Payer: Medicaid Other | Attending: Family Medicine | Admitting: Family Medicine

## 2019-12-03 DIAGNOSIS — Z113 Encounter for screening for infections with a predominantly sexual mode of transmission: Secondary | ICD-10-CM | POA: Diagnosis not present

## 2019-12-03 DIAGNOSIS — N898 Other specified noninflammatory disorders of vagina: Secondary | ICD-10-CM | POA: Diagnosis present

## 2019-12-03 LAB — POCT URINALYSIS DIP (DEVICE)
Bilirubin Urine: NEGATIVE
Bilirubin Urine: NEGATIVE
Glucose, UA: NEGATIVE mg/dL
Glucose, UA: NEGATIVE mg/dL
Hgb urine dipstick: NEGATIVE
Ketones, ur: NEGATIVE mg/dL
Ketones, ur: NEGATIVE mg/dL
Nitrite: NEGATIVE
Nitrite: NEGATIVE
Protein, ur: 30 mg/dL — AB
Protein, ur: NEGATIVE mg/dL
Specific Gravity, Urine: 1.025 (ref 1.005–1.030)
Specific Gravity, Urine: 1.025 (ref 1.005–1.030)
Urobilinogen, UA: 0.2 mg/dL (ref 0.0–1.0)
Urobilinogen, UA: 0.2 mg/dL (ref 0.0–1.0)
pH: 6.5 (ref 5.0–8.0)
pH: 7 (ref 5.0–8.0)

## 2019-12-03 MED ORDER — METRONIDAZOLE 500 MG PO TABS
ORAL_TABLET | ORAL | Status: AC
  Filled 2019-12-03: qty 4

## 2019-12-03 MED ORDER — CEFTRIAXONE SODIUM 250 MG IJ SOLR
INTRAMUSCULAR | Status: AC
  Filled 2019-12-03: qty 250

## 2019-12-03 MED ORDER — METRONIDAZOLE 500 MG PO TABS
2000.0000 mg | ORAL_TABLET | Freq: Once | ORAL | Status: AC
  Administered 2019-12-03: 2000 mg via ORAL

## 2019-12-03 MED ORDER — AZITHROMYCIN 250 MG PO TABS
1000.0000 mg | ORAL_TABLET | Freq: Once | ORAL | Status: AC
  Administered 2019-12-03: 1000 mg via ORAL

## 2019-12-03 MED ORDER — CEFTRIAXONE SODIUM 250 MG IJ SOLR
250.0000 mg | Freq: Once | INTRAMUSCULAR | Status: AC
  Administered 2019-12-03: 250 mg via INTRAMUSCULAR

## 2019-12-03 MED ORDER — LIDOCAINE HCL 2 % IJ SOLN
INTRAMUSCULAR | Status: AC
  Filled 2019-12-03: qty 20

## 2019-12-03 MED ORDER — METRONIDAZOLE 500 MG PO TABS
ORAL_TABLET | ORAL | Status: AC
  Filled 2019-12-03: qty 1

## 2019-12-03 MED ORDER — AZITHROMYCIN 250 MG PO TABS
ORAL_TABLET | ORAL | Status: AC
  Filled 2019-12-03: qty 4

## 2019-12-03 NOTE — ED Triage Notes (Addendum)
Pt present vaginal discharge that is green/yellowish. Symptoms started yesterday. Pt would like a full STD panel .

## 2019-12-03 NOTE — ED Provider Notes (Signed)
[place]    CSN: [id number] Arrival date & time: 12/03/19  1410      History   Chief Complaint Chief Complaint  Patient presents with  . Vaginal Discharge    HPI Tiney K Bells is a 23 y.o. female.   Patient is a 23 year old female who presents today with green/yellowish vaginal discharge that started yesterday.  She was seen here approximate 2 days ago and treated for yeast infection and UTI.  She declined STD testing at that time.  She is here today requesting STD panel and treatment.  Denies any associated abdominal pain, back pain, fever, nausea or vomiting. Patient's last menstrual period was 11/26/2019.  ROS per HPI      Past Medical History:  Diagnosis Date  . Eczema     There are no problems to display for this patient.   History reviewed. No pertinent surgical history.  OB History    Gravida  1   Para      Term      Preterm      AB      Living        SAB      TAB      Ectopic      Multiple      Live Births               Home Medications    Prior to Admission medications   Medication Sig Start Date End Date Taking? Authorizing Provider  nitrofurantoin, macrocrystal-monohydrate, (MACROBID) 100 MG capsule Take 1 capsule (100 mg total) by mouth 2 (two) times daily. 12/01/19   Peri Jefferson, PA-C    Family History Family History  Problem Relation Age of Onset  . Healthy Mother   . Healthy Father     Social History Social History   Tobacco Use  . Smoking status: Never Smoker  . Smokeless tobacco: Never Used  Substance Use Topics  . Alcohol use: No  . Drug use: No     Allergies   Patient has no known allergies.   Review of Systems Review of Systems   Physical Exam Triage Vital Signs ED Triage Vitals  Enc Vitals Group     BP 12/03/19 1424 (!) 127/91     Pulse Rate 12/03/19 1424 68     Resp 12/03/19 1424 16     Temp 12/03/19 1424 98.4 F (36.9 C)     Temp Source 12/03/19 1424 Oral     SpO2  12/03/19 1424 100 %     Weight --      Height --      Head Circumference --      Peak Flow --      Pain Score 12/03/19 1427 0     Pain Loc --      Pain Edu? --      Excl. in [place]? --    No data found.  Updated Vital Signs BP (!) 127/91 (BP Location: Left Arm)   Pulse 68   Temp 98.4 F (36.9 C) (Oral)   Resp 16   LMP 11/26/2019   SpO2 100%   Visual Acuity Right Eye Distance:   Left Eye Distance:   Bilateral Distance:    Right Eye Near:   Left Eye Near:    Bilateral Near:     Physical Exam Vitals and nursing note reviewed.  Constitutional:      General: She is not in acute distress.    Appearance: Normal appearance.  She is not ill-appearing, toxic-appearing or diaphoretic.  HENT:     Head: Normocephalic.     Nose: Nose normal.     Mouth/Throat:     Pharynx: Oropharynx is clear.  Eyes:     Conjunctiva/sclera: Conjunctivae normal.  Pulmonary:     Effort: Pulmonary effort is normal.  Abdominal:     Palpations: Abdomen is soft.     Tenderness: There is no abdominal tenderness.  Musculoskeletal:        General: Normal range of motion.     Cervical back: Normal range of motion.  Skin:    General: Skin is warm and dry.     Findings: No rash.  Neurological:     Mental Status: She is alert.  Psychiatric:        Mood and Affect: Mood normal.      UC Treatments / Results  Labs (all labs ordered are listed, but only abnormal results are displayed) Labs Reviewed  CERVICOVAGINAL ANCILLARY ONLY    EKG   Radiology No results found.  Procedures Procedures (including critical care time)  Medications Ordered in UC Medications  cefTRIAXone (ROCEPHIN) injection 250 mg (has no administration in time range)  azithromycin (ZITHROMAX) tablet 1,000 mg (has no administration in time range)  metroNIDAZOLE (FLAGYL) tablet 2,000 mg (has no administration in time range)    Initial Impression / Assessment and Plan / UC Course  I have reviewed the triage vital signs  and the nursing notes.  Pertinent labs & imaging results that were available during my care of the patient were reviewed by me and considered in my medical decision making (see chart for details).     Vaginal discharge- treating for gonorrhea, chlamydia and trichomonas based on symptoms Swab sent for testing  safe sex practices  Final Clinical Impressions(s) / UC Diagnoses   Final diagnoses:  Vaginal discharge     Discharge Instructions     Treating you for gonorrhea, chlamydia and trichomoniasis today.  We will call you with any positive results of your swab.  Please refrain from sexual activity     ED Prescriptions    None     PDMP not reviewed this encounter.   Dahlia Byes A, NP 12/03/19 1500

## 2019-12-03 NOTE — Discharge Instructions (Addendum)
Treating you for gonorrhea, chlamydia and trichomoniasis today.  We will call you with any positive results of your swab.  Please refrain from sexual activity

## 2019-12-05 LAB — CERVICOVAGINAL ANCILLARY ONLY
Bacterial vaginitis: POSITIVE — AB
Candida vaginitis: POSITIVE — AB
Chlamydia: NEGATIVE
Neisseria Gonorrhea: NEGATIVE
Trichomonas: POSITIVE — AB

## 2019-12-06 ENCOUNTER — Telehealth: Payer: Self-pay | Admitting: Emergency Medicine

## 2019-12-06 MED ORDER — METRONIDAZOLE 0.75 % VA GEL: 1.0000 | Freq: Every day | VAGINAL | 0 refills | Status: AC

## 2019-12-06 NOTE — Telephone Encounter (Signed)
Trichomonas is positive. Rx metronidazole was given at the urgent care visit. Pt needs education to please refrain from sexual intercourse for 7 days to give the medicine time to work. Sexual partners need to be notified and tested/treated. Condoms may reduce risk of reinfection. Recheck for further evaluation if symptoms are not improving.   Bacterial vaginosis is positive. This was not treated at the urgent care visit.  metrogel no refills sent to patients pharmacy of choice.    Candida (yeast) is positive.  Prescription for fluconazole was given at the urgent care visit.    Patient contacted by phone and made aware of    results. Pt verbalized understanding and had all questions answered.

## 2020-01-16 ENCOUNTER — Encounter (HOSPITAL_COMMUNITY): Payer: Self-pay | Admitting: Emergency Medicine

## 2020-01-16 ENCOUNTER — Ambulatory Visit (HOSPITAL_COMMUNITY)
Admission: EM | Admit: 2020-01-16 | Discharge: 2020-01-16 | Disposition: A | Discharge status: Home / Self Care | Payer: Medicaid Other | Attending: Family Medicine | Admitting: Family Medicine

## 2020-01-16 ENCOUNTER — Other Ambulatory Visit: Payer: Self-pay

## 2020-01-16 DIAGNOSIS — L509 Urticaria, unspecified: Secondary | ICD-10-CM

## 2020-01-16 MED ORDER — PREDNISONE 10 MG (21) PO TBPK: ORAL_TABLET | Freq: Every day | ORAL | 0 refills | Status: DC

## 2020-01-16 NOTE — ED Triage Notes (Signed)
PT was here 12/14 and had multiple antibiotics. Since that visit she has had intermittent breakout of rashes. She has used benadryl, eczema cream, and cetaphil.

## 2020-01-16 NOTE — ED Provider Notes (Signed)
[facility]   [id number] 01/16/20 Arrival Time: 0940  ASSESSMENT & PLAN:  1. Urticaria     Will treat as hives given her description that fits. Unknown trigger.  Begin: Meds ordered this encounter  Medications  . predniSONE (STERAPRED UNI-PAK 21 TAB) 10 MG (21) TBPK tablet    Sig: Take by mouth daily. Take as directed.    Dispense:  21 tablet    Refill:  0    Benadryl as needed. Will follow up with PCP or here if worsening or failing to improve as anticipated. Reviewed expectations re: course of current medical issues. Questions answered. Outlined signs and symptoms indicating need for more acute intervention. Patient verbalized understanding. After Visit Summary given.   SUBJECTIVE:  Danielle Gibbs is a 24 y.o. female who presents with a skin complaint. H/O hives in distant past. For the past month or more reports almost daily itchy whelps of her skin; comes and goes; different locations. Benadryl with some help. OTC steroid cream helps. Has changed soap to cetaphil and feels this has helped. No resp or swallowing difficulties. Remembers taking prednisone in the past and would like to try again. No new exposures known. Otherwise well. Afebrile.  OBJECTIVE: Vitals:   01/16/20 1000  BP: 121/77  Pulse: 83  Temp: 98.1 F (36.7 C)  TempSrc: Oral  SpO2: 98%    General appearance: alert; no distress HEENT: [place]; AT Neck: supple with FROM Lungs: clear to auscultation bilaterally Heart: regular rate and rhythm Extremities: no edema; moves all extremities normally Skin: warm and dry; no current rash Psychological: alert and cooperative; normal mood and affect  No Known Allergies  Past Medical History:  Diagnosis Date  . Eczema    Social History   Socioeconomic History  . Marital status: Single    Spouse name: Not on file  . Number of children: Not on file  . Years of education: Not on file  . Highest education level: Not on file  Occupational History  .  Not on file  Tobacco Use  . Smoking status: Never Smoker  . Smokeless tobacco: Never Used  Substance and Sexual Activity  . Alcohol use: No  . Drug use: No  . Sexual activity: Not on file  Other Topics Concern  . Not on file  Social History Narrative  . Not on file   Social Determinants of Health   Financial Resource Strain:   . Difficulty of Paying Living Expenses: Not on file  Food Insecurity:   . Worried About Programme researcher, broadcasting/film/video in the Last Year: Not on file  . Ran Out of Food in the Last Year: Not on file  Transportation Needs:   . Lack of Transportation (Medical): Not on file  . Lack of Transportation (Non-Medical): Not on file  Physical Activity:   . Days of Exercise per Week: Not on file  . Minutes of Exercise per Session: Not on file  Stress:   . Feeling of Stress : Not on file  Social Connections:   . Frequency of Communication with Friends and Family: Not on file  . Frequency of Social Gatherings with Friends and Family: Not on file  . Attends Religious Services: Not on file  . Active Member of Clubs or Organizations: Not on file  . Attends Banker Meetings: Not on file  . Marital Status: Not on file  Intimate Partner Violence:   . Fear of Current or Ex-Partner: Not on file  . Emotionally Abused:  Not on file  . Physically Abused: Not on file  . Sexually Abused: Not on file   Family History  Problem Relation Age of Onset  . Healthy Mother   . Healthy Father    History reviewed. No pertinent surgical history.   Vanessa Kick, MD 01/16/20 1017

## 2020-02-04 ENCOUNTER — Emergency Department
Admission: EM | Admit: 2020-02-04 | Discharge: 2020-02-04 | Disposition: A | Discharge status: Home / Self Care | Payer: Medicaid Other | Attending: Emergency Medicine | Admitting: Emergency Medicine

## 2020-02-04 ENCOUNTER — Other Ambulatory Visit: Payer: Self-pay

## 2020-02-04 ENCOUNTER — Encounter: Payer: Self-pay | Admitting: Emergency Medicine

## 2020-02-04 DIAGNOSIS — B9689 Other specified bacterial agents as the cause of diseases classified elsewhere: Secondary | ICD-10-CM

## 2020-02-04 DIAGNOSIS — N760 Acute vaginitis: Secondary | ICD-10-CM | POA: Insufficient documentation

## 2020-02-04 DIAGNOSIS — N898 Other specified noninflammatory disorders of vagina: Secondary | ICD-10-CM | POA: Diagnosis present

## 2020-02-04 LAB — WET PREP, GENITAL
Sperm: NONE SEEN
Trich, Wet Prep: NONE SEEN
Yeast Wet Prep HPF POC: NONE SEEN

## 2020-02-04 LAB — CHLAMYDIA/NGC RT PCR (ARMC ONLY)??????????

## 2020-02-04 LAB — CHLAMYDIA/NGC RT PCR (ARMC ONLY)
Chlamydia Tr: NOT DETECTED
N gonorrhoeae: NOT DETECTED

## 2020-02-04 MED ORDER — METRONIDAZOLE 500 MG PO TABS: 500.0000 mg | ORAL_TABLET | Freq: Two times a day (BID) | ORAL | 0 refills | Status: DC

## 2020-02-04 NOTE — ED Notes (Signed)
See triage note  Presents with possible abscess area to left groin  States she noticed area about 1 week ago  But now area is larger

## 2020-02-04 NOTE — Discharge Instructions (Signed)
Follow-up with your primary care provider if any continued problems or concerns.  Begin taking the Flagyl 500 mg twice a day for the next 7 days.  Do not drink alcohol or mix this medication with cold medication containing alcohol as it will cause violent vomiting.  Increase fluids.

## 2020-02-04 NOTE — ED Provider Notes (Signed)
[facility] Emergency Department Provider Note  ____________________________________________   First MD Initiated Contact with Patient 02/04/20 1315     (approximate)  I have reviewed the triage vital signs and the nursing notes.   HISTORY  Chief Complaint Abscess   HPI Danielle Gibbs is a 24 y.o. female presents to the ED with complaint of possible abscess to her left groin area.  Patient states she noticed it 5 days ago and then it improved.  She states that it returned and she is worried that it is an abscess.  Patient currently is on her menstrual period and is unaware of any vaginal discharge.  She states there was some minor discharge prior to her menses and also that she has a different odor.  She rates her pain as an 8 out of 10.       Past Medical History:  Diagnosis Date  . Eczema     There are no problems to display for this patient.   History reviewed. No pertinent surgical history.  Prior to Admission medications   Medication Sig Start Date End Date Taking? Authorizing Provider  metroNIDAZOLE (FLAGYL) 500 MG tablet Take 1 tablet (500 mg total) by mouth 2 (two) times daily. 02/04/20   Johnn Hai, PA-C    Allergies Patient has no known allergies.  Family History  Problem Relation Age of Onset  . Healthy Mother   . Healthy Father     Social History Social History   Tobacco Use  . Smoking status: Never Smoker  . Smokeless tobacco: Never Used  Substance Use Topics  . Alcohol use: No  . Drug use: No    Review of Systems Constitutional: No fever/chills Cardiovascular: Denies chest pain. Respiratory: Denies shortness of breath. Gastrointestinal: No abdominal pain.  No nausea, no vomiting.  No diarrhea.  No constipation. Genitourinary: Negative for dysuria.  Positive vaginal odor.  Positive menses. Musculoskeletal: Negative for back pain. Skin: Negative for rash. Neurological: Negative for headaches, focal weakness or  numbness. ____________________________________________   PHYSICAL EXAM:  VITAL SIGNS: ED Triage Vitals  Enc Vitals Group     BP 02/04/20 1218 114/71     Pulse Rate 02/04/20 1218 (!) 109     Resp 02/04/20 1218 18     Temp 02/04/20 1218 98 F (36.7 C)     Temp Source 02/04/20 1218 Oral     SpO2 02/04/20 1218 97 %     Weight 02/04/20 1219 120 lb (54.4 kg)     Height 02/04/20 1219 5\' 7"  (1.702 m)     Head Circumference --      Peak Flow --      Pain Score 02/04/20 1257 8     Pain Loc --      Pain Edu? --      Excl. in [place]? --    Constitutional: Alert and oriented. Well appearing and in no acute distress. Eyes: Conjunctivae are normal.  Head: Atraumatic. Neck: No stridor.   Cardiovascular: Normal rate, regular rhythm. Grossly normal heart sounds.  Good peripheral circulation. Respiratory: Normal respiratory effort.  No retractions. Lungs CTAB. Gastrointestinal: Soft and nontender. No distention.  Genitourinary: There is an area of irritation on examination of external genitalia.  This area is at approximately 6 o'clock position.  Menstrual blood is present in the vaginal vault.  Wet prep and GC and gonorrhea cultures were obtained. Musculoskeletal: Moves upper and lower extremities with any difficulty.  Normal gait was noted. Neurologic:  Normal speech and language. No gross focal neurologic deficits are appreciated. No gait instability. Skin:  Skin is warm, dry and intact.  Psychiatric: Mood and affect are normal. Speech and behavior are normal.  ____________________________________________   LABS (all labs ordered are listed, but only abnormal results are displayed)  Labs Reviewed  WET PREP, GENITAL - Abnormal; Notable for the following components:      Result Value   Clue Cells Wet Prep HPF POC MANY (*)    WBC, Wet Prep HPF POC FEW (*)    All other components within normal limits  CHLAMYDIA/NGC RT PCR ([facility] ONLY)     PROCEDURES  Procedure(s) performed  (including Critical Care):  Procedures   ____________________________________________   INITIAL IMPRESSION / ASSESSMENT AND PLAN / ED COURSE  As part of my medical decision making, I reviewed the following data within the electronic MEDICAL RECORD NUMBER Notes from prior ED visits and [place] Controlled Substance Database  24 year old female presents to the ED with complaint of possible abscess to her left groin however on exam this appears to be a lymph node.  Patient states that this was noticed approximate 5 days ago and improved and then she noticed it again today.  She also complained of vaginal odor.  Wet prep shows clue cells suggestive of a bacterial vaginosis.  Patient was given a prescription for Flagyl 500 mg twice daily with food.  She is to follow-up with her PCP if any continued problems.  She was also made aware that her GC and Chlamydia cultures did not result today and should be available in the next 2 days.  ____________________________________________   FINAL CLINICAL IMPRESSION(S) / ED DIAGNOSES  Final diagnoses:  BV (bacterial vaginosis)     ED Discharge Orders         Ordered    metroNIDAZOLE (FLAGYL) 500 MG tablet  2 times daily     02/04/20 1604           Note:  This document was prepared using Dragon voice recognition software and may include unintentional dictation errors.    Tommi Rumps, PA-C 02/04/20 1614    Sharman Cheek, MD 02/05/20 [phone]

## 2020-02-04 NOTE — ED Triage Notes (Signed)
Patient presents to the ED with a "lump" near her vaginal that is causing her pain.  Patient states she noticed it last Wednesday but then it improved and then it came back worse.  Patient is in no obvious distress at this time.  Patient denies history of the same.

## 2020-02-07 ENCOUNTER — Other Ambulatory Visit: Payer: Self-pay

## 2020-02-07 ENCOUNTER — Ambulatory Visit (HOSPITAL_COMMUNITY)
Admission: EM | Admit: 2020-02-07 | Discharge: 2020-02-07 | Disposition: A | Discharge status: Home / Self Care | Payer: Medicaid Other | Attending: Family Medicine | Admitting: Family Medicine

## 2020-02-07 ENCOUNTER — Encounter (HOSPITAL_COMMUNITY): Payer: Self-pay | Admitting: Emergency Medicine

## 2020-02-07 DIAGNOSIS — N898 Other specified noninflammatory disorders of vagina: Secondary | ICD-10-CM | POA: Diagnosis present

## 2020-02-07 LAB — RAPID HIV SCREEN (HIV 1/2 AB+AG)
HIV 1/2 Antibodies: NONREACTIVE
HIV-1 P24 Antigen - HIV24: NONREACTIVE

## 2020-02-07 MED ORDER — NYSTATIN-TRIAMCINOLONE 100000-0.1 UNIT/GM-% EX CREA: TOPICAL_CREAM | CUTANEOUS | 0 refills | Status: DC

## 2020-02-07 MED ORDER — FLUCONAZOLE 150 MG PO TABS: 150.0000 mg | ORAL_TABLET | Freq: Every day | ORAL | 0 refills | Status: DC

## 2020-02-07 NOTE — Discharge Instructions (Addendum)
Treating you with what is most likely a yeast infection. Take the Diflucan 1 pill now and 1 pill in 3 days if you are still having symptoms.  Use the cream up to 2 times a day We will call you with any positive results of your swab or blood test.

## 2020-02-07 NOTE — ED Triage Notes (Addendum)
Patient has bumps on labia, reports itching, noticed today while showering.  Patient reports being seen at [facility]  (02/04/2020)and was tested for std, was told she has BV  Patient reports vaginal discharge, but menstrual cycle is ending  Patient maintained texting during entire in-take process

## 2020-02-08 LAB — RPR: RPR Ser Ql: NONREACTIVE

## 2020-02-09 NOTE — ED Provider Notes (Signed)
[facility]    CSN: [id number] Arrival date & time: 02/07/20  1428      History   Chief Complaint Chief Complaint  Patient presents with  . Rash    HPI Danielle Gibbs is a 24 y.o. female.   Pt is a 24 year old female that presents with discharge and rash to the vaginal area. This has been constant for the past few days.  Reporting some vaginal itching.  Was seen on 02/04/2020 and tested for STDs with negative results.  She was positive for BV.  Currently taking medication for BV treatment.  Denies any new sexual contact since testing.  Denies any fever, chills, abdominal pain, back pain, dysuria, hematuria or urinary frequency. Patient's last menstrual period was 01/16/2020.  ROS per HPI      Past Medical History:  Diagnosis Date  . Eczema     There are no problems to display for this patient.   History reviewed. No pertinent surgical history.  OB History    Gravida  1   Para      Term      Preterm      AB      Living        SAB      TAB      Ectopic      Multiple      Live Births               Home Medications    Prior to Admission medications   Medication Sig Start Date End Date Taking? Authorizing Provider  metroNIDAZOLE (FLAGYL) 500 MG tablet Take 1 tablet (500 mg total) by mouth 2 (two) times daily. 02/04/20  Yes Tommi Rumps, PA-C  fluconazole (DIFLUCAN) 150 MG tablet Take 1 tablet (150 mg total) by mouth daily. 02/07/20   Janace Aris, NP  nystatin-triamcinolone (MYCOLOG II) cream Apply to affected area daily 02/07/20   Janace Aris, NP    Family History Family History  Problem Relation Age of Onset  . Healthy Mother   . Healthy Father     Social History Social History   Tobacco Use  . Smoking status: Never Smoker  . Smokeless tobacco: Never Used  Substance Use Topics  . Alcohol use: No  . Drug use: No     Allergies   Patient has no known allergies.   Review of Systems Review of Systems   Physical  Exam Triage Vital Signs ED Triage Vitals  Enc Vitals Group     BP 02/07/20 1504 118/77     Pulse Rate 02/07/20 1504 79     Resp 02/07/20 1504 16     Temp 02/07/20 1504 98.6 F (37 C)     Temp Source 02/07/20 1504 Oral     SpO2 02/07/20 1504 100 %     Weight --      Height --      Head Circumference --      Peak Flow --      Pain Score 02/07/20 1501 10     Pain Loc --      Pain Edu? --      Excl. in [place]? --    No data found.  Updated Vital Signs BP 118/77 (BP Location: Left Arm)   Pulse 79   Temp 98.6 F (37 C) (Oral)   Resp 16   LMP 01/16/2020   SpO2 100%   Visual Acuity Right Eye Distance:   Left  Eye Distance:   Bilateral Distance:    Right Eye Near:   Left Eye Near:    Bilateral Near:     Physical Exam Vitals and nursing note reviewed.  Constitutional:      General: She is not in acute distress.    Appearance: Normal appearance. She is not ill-appearing, toxic-appearing or diaphoretic.  HENT:     Head: Normocephalic.     Nose: Nose normal.  Eyes:     Conjunctiva/sclera: Conjunctivae normal.  Pulmonary:     Effort: Pulmonary effort is normal.  Genitourinary:    Comments: erythematous vulva with yeast  No obvious lesions. Was able to wipe off yeast with gauze Musculoskeletal:        General: Normal range of motion.     Cervical back: Normal range of motion.  Skin:    General: Skin is warm and dry.     Findings: No rash.  Neurological:     Mental Status: She is alert.  Psychiatric:        Mood and Affect: Mood normal.      UC Treatments / Results  Labs (all labs ordered are listed, but only abnormal results are displayed) Labs Reviewed  HSV CULTURE AND TYPING  RAPID HIV SCREEN (HIV 1/2 AB+AG)  RPR    EKG   Radiology No results found.  Procedures Procedures (including critical care time)  Medications Ordered in UC Medications - No data to display  Initial Impression / Assessment and Plan / UC Course  I have reviewed the triage  vital signs and the nursing notes.  Pertinent labs & imaging results that were available during my care of the patient were reviewed by me and considered in my medical decision making (see chart for details).     Yeast infection.  Most likely diagnosis based on exam and symptoms. Patient currently take antibiotics which most likely cause yeast infection. No concern for HSV at this time but patient was to be swabbed for HSV to rule that out. She also like to be tested for HIV and syphilis Treating with Diflucan and Mycolog cream. Follow up as needed for continued or worsening symptoms  Final Clinical Impressions(s) / UC Diagnoses   Final diagnoses:  Vaginal discharge     Discharge Instructions     Treating you with what is most likely a yeast infection. Take the Diflucan 1 pill now and 1 pill in 3 days if you are still having symptoms.  Use the cream up to 2 times a day We will call you with any positive results of your swab or blood test.     ED Prescriptions    Medication Sig Dispense Auth. Provider   fluconazole (DIFLUCAN) 150 MG tablet Take 1 tablet (150 mg total) by mouth daily. 2 tablet Emerly Prak A, NP   nystatin-triamcinolone (MYCOLOG II) cream Apply to affected area daily 15 g Malakye Nolden A, NP     PDMP not reviewed this encounter.   Loura Halt A, NP 02/09/20 1037

## 2020-02-10 LAB — HSV CULTURE AND TYPING

## 2020-02-11 ENCOUNTER — Telehealth (HOSPITAL_COMMUNITY): Payer: Self-pay | Admitting: Emergency Medicine

## 2020-02-11 MED ORDER — VALACYCLOVIR HCL 1 G PO TABS: 1000.0000 mg | ORAL_TABLET | Freq: Three times a day (TID) | ORAL | 0 refills | Status: AC

## 2020-02-11 NOTE — Telephone Encounter (Signed)
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 Patient contacted by phone and made aware of    results. Pt verbalized understanding and had all questions answered.  Medication sent to pharmacy on record.

## 2020-03-08 ENCOUNTER — Ambulatory Visit: Payer: Medicaid Other | Attending: Internal Medicine

## 2020-03-08 DIAGNOSIS — Z23 Encounter for immunization: Secondary | ICD-10-CM

## 2020-03-08 NOTE — Progress Notes (Signed)
   Covid-19 Vaccination Clinic  Name:  Danielle Gibbs    MRN: [id number] DOB: 02-07-1996  03/08/2020  Ms. Pitstick was observed post Covid-19 immunization for 15 minutes without incident. She was provided with Vaccine Information Sheet and instruction to access the V-Safe system.   Ms. Stiner was instructed to call 911 with any severe reactions post vaccine: Marland Kitchen Difficulty breathing  . Swelling of face and throat  . A fast heartbeat  . A bad rash all over body  . Dizziness and weakness   Immunizations Administered    Name Date Dose VIS Date Route   Pfizer COVID-19 Vaccine 03/08/2020  1:22 PM 0.3 mL 11/30/2019 Intramuscular   Manufacturer: [employer], [employer]   Lot: [id number]   NDC: [address]-6

## 2020-04-02 ENCOUNTER — Ambulatory Visit: Payer: Medicaid Other | Attending: Internal Medicine

## 2020-04-02 DIAGNOSIS — Z23 Encounter for immunization: Secondary | ICD-10-CM

## 2020-04-02 NOTE — Progress Notes (Signed)
   Covid-19 Vaccination Clinic  Name:  Danielle Gibbs    MRN: [id number] DOB: [birth year]  04/02/2020  [name] was observed post Covid-19 immunization for 15 minutes without incident. She was provided with Vaccine Information Sheet and instruction to access the V-Safe system.   [name] was instructed to call 911 with any severe reactions post vaccine: Marland Kitchen Difficulty breathing  . Swelling of face and throat  . A fast heartbeat  . A bad rash all over body  . Dizziness and weakness   Immunizations Administered    Name Date Dose VIS Date Route   Pfizer COVID-19 Vaccine 04/02/2020 10:43 AM 0.3 mL 11/30/2019 Intramuscular   Manufacturer: [employer], [employer]   Lot: [id number]   NDC: [address]-3

## 2020-04-16 ENCOUNTER — Ambulatory Visit: Payer: Medicaid Other | Attending: Internal Medicine

## 2020-04-16 DIAGNOSIS — Z20822 Contact with and (suspected) exposure to covid-19: Secondary | ICD-10-CM

## 2020-04-17 LAB — NOVEL CORONAVIRUS, NAA: SARS-CoV-2, NAA: NOT DETECTED

## 2020-04-17 LAB — SARS-COV-2, NAA 2 DAY TAT

## 2020-04-29 DIAGNOSIS — L732 Hidradenitis suppurativa: Secondary | ICD-10-CM | POA: Insufficient documentation

## 2020-05-28 ENCOUNTER — Ambulatory Visit: Payer: Medicaid Other | Attending: Internal Medicine

## 2020-05-28 DIAGNOSIS — Z20822 Contact with and (suspected) exposure to covid-19: Secondary | ICD-10-CM

## 2020-05-29 LAB — NOVEL CORONAVIRUS, NAA: SARS-CoV-2, NAA: NOT DETECTED

## 2020-05-29 LAB — SARS-COV-2, NAA 2 DAY TAT

## 2020-08-20 ENCOUNTER — Other Ambulatory Visit: Payer: Self-pay

## 2020-08-20 ENCOUNTER — Encounter (HOSPITAL_COMMUNITY): Payer: Self-pay | Admitting: Emergency Medicine

## 2020-08-20 ENCOUNTER — Ambulatory Visit (HOSPITAL_COMMUNITY)
Admission: EM | Admit: 2020-08-20 | Discharge: 2020-08-20 | Disposition: A | Discharge status: Home / Self Care | Payer: Medicaid Other | Attending: Family Medicine | Admitting: Family Medicine

## 2020-08-20 DIAGNOSIS — R519 Headache, unspecified: Secondary | ICD-10-CM | POA: Diagnosis present

## 2020-08-20 DIAGNOSIS — Z79899 Other long term (current) drug therapy: Secondary | ICD-10-CM | POA: Diagnosis not present

## 2020-08-20 DIAGNOSIS — Z20822 Contact with and (suspected) exposure to covid-19: Secondary | ICD-10-CM | POA: Diagnosis not present

## 2020-08-20 DIAGNOSIS — J069 Acute upper respiratory infection, unspecified: Secondary | ICD-10-CM | POA: Insufficient documentation

## 2020-08-20 NOTE — ED Triage Notes (Signed)
Pt presents with cough, headache, nasal congestion, sore throat xs 2 days. Denies sob, chest pain, loss of taste or smell.

## 2020-08-20 NOTE — ED Provider Notes (Signed)
[facility]    CSN: [id number] Arrival date & time: 08/20/20  1840      History   Chief Complaint Chief Complaint  Patient presents with  . Headache    HPI Danielle Gibbs is a 24 y.o. female.   Danielle Gibbs presents with complaints of sore throat, loss of voice, dry cough, headache, nasal drainage. No known fevers. No shortness of breath. No body aches.  No gi symptoms. She is menstruating. Has tried taking benadryl and vicks which have minimally helped. Her children have cold symptoms as well. Has received covid vaccines. Works from home. Symptoms started yesteday    ROS per HPI, negative if not otherwise mentioned.      Past Medical History:  Diagnosis Date  . Eczema     There are no problems to display for this patient.   History reviewed. No pertinent surgical history.  OB History    Gravida  1   Para      Term      Preterm      AB      Living        SAB      TAB      Ectopic      Multiple      Live Births               Home Medications    Prior to Admission medications   Medication Sig Start Date End Date Taking? Authorizing Provider  fluconazole (DIFLUCAN) 150 MG tablet Take 1 tablet (150 mg total) by mouth daily. 02/07/20   Dahlia Byes A, NP  metroNIDAZOLE (FLAGYL) 500 MG tablet Take 1 tablet (500 mg total) by mouth 2 (two) times daily. 02/04/20   Tommi Rumps, PA-C  nystatin-triamcinolone (MYCOLOG II) cream Apply to affected area daily 02/07/20   Janace Aris, NP    Family History Family History  Problem Relation Age of Onset  . Healthy Mother   . Healthy Father     Social History Social History   Tobacco Use  . Smoking status: Never Smoker  . Smokeless tobacco: Never Used  Vaping Use  . Vaping Use: Never used  Substance Use Topics  . Alcohol use: No  . Drug use: No     Allergies   Patient has no known allergies.   Review of Systems Review of Systems   Physical Exam Triage Vital Signs ED  Triage Vitals  Enc Vitals Group     BP 08/20/20 1946 126/73     Pulse Rate 08/20/20 1946 94     Resp 08/20/20 1946 18     Temp 08/20/20 1946 98.4 F (36.9 C)     Temp Source 08/20/20 1946 Oral     SpO2 08/20/20 1946 99 %     Weight --      Height --      Head Circumference --      Peak Flow --      Pain Score 08/20/20 1943 0     Pain Loc --      Pain Edu? --      Excl. in [place]? --    No data found.  Updated Vital Signs BP 126/73 (BP Location: Right Arm)   Pulse 94   Temp 98.4 F (36.9 C) (Oral)   Resp 18   LMP 08/19/2020   SpO2 99%   Visual Acuity Right Eye Distance:   Left Eye Distance:   Bilateral Distance:  Right Eye Near:   Left Eye Near:    Bilateral Near:     Physical Exam Constitutional:      General: She is not in acute distress.    Appearance: She is well-developed.  Cardiovascular:     Rate and Rhythm: Normal rate.  Pulmonary:     Effort: Pulmonary effort is normal.  Skin:    General: Skin is warm and dry.  Neurological:     Mental Status: She is alert and oriented to person, place, and time.      UC Treatments / Results  Labs (all labs ordered are listed, but only abnormal results are displayed) Labs Reviewed  SARS CORONAVIRUS 2 (TAT 6-24 HRS)    EKG   Radiology No results found.  Procedures Procedures (including critical care time)  Medications Ordered in UC Medications - No data to display  Initial Impression / Assessment and Plan / UC Course  I have reviewed the triage vital signs and the nursing notes.  Pertinent labs & imaging results that were available during my care of the patient were reviewed by me and considered in my medical decision making (see chart for details).     Non toxic. Benign physical exam.  No work of breathing. History and physical consistent with viral illness.  Covid testing pending and isolation instructions provided.  Supportive cares recommended. Return precautions provided. Patient verbalized  understanding and agreeable to plan.   Final Clinical Impressions(s) / UC Diagnoses   Final diagnoses:  Upper respiratory tract infection, unspecified type     Discharge Instructions     Push fluids to ensure adequate hydration and keep secretions thin.  Tylenol and/or ibuprofen as needed for pain or fevers.  Throat lozenges, gargles, chloraseptic spray, warm teas, popsicles etc to help with throat pain.   Self isolate until covid results are back and negative.  Will notify you by phone of any positive findings. Your negative results will be sent through your MyChart.     If symptoms worsen or do not improve in the next week to return to be seen or to follow up with your PCP.       ED Prescriptions    None     PDMP not reviewed this encounter.   Georgetta Haber, NP 08/20/20 2011

## 2020-08-20 NOTE — Discharge Instructions (Signed)
Push fluids to ensure adequate hydration and keep secretions thin.  Tylenol and/or ibuprofen as needed for pain or fevers.  Throat lozenges, gargles, chloraseptic spray, warm teas, popsicles etc to help with throat pain.   Self isolate until covid results are back and negative.  Will notify you by phone of any positive findings. Your negative results will be sent through your MyChart.     If symptoms worsen or do not improve in the next week to return to be seen or to follow up with your PCP.

## 2020-08-21 LAB — SARS CORONAVIRUS 2 (TAT 6-24 HRS): SARS Coronavirus 2: NEGATIVE

## 2020-09-23 ENCOUNTER — Other Ambulatory Visit: Payer: Self-pay

## 2020-09-23 ENCOUNTER — Ambulatory Visit
Admission: RE | Admit: 2020-09-23 | Discharge: 2020-09-23 | Disposition: A | Discharge status: Home / Self Care | Payer: Medicaid Other | Source: Ambulatory Visit | Attending: Emergency Medicine | Admitting: Emergency Medicine

## 2020-09-23 VITALS — BP 123/82 | HR 86 | Temp 98.6°F | Resp 19

## 2020-09-23 DIAGNOSIS — H01131 Eczematous dermatitis of right upper eyelid: Secondary | ICD-10-CM | POA: Diagnosis not present

## 2020-09-23 DIAGNOSIS — H01134 Eczematous dermatitis of left upper eyelid: Secondary | ICD-10-CM | POA: Diagnosis not present

## 2020-09-23 DIAGNOSIS — W57XXXA Bitten or stung by nonvenomous insect and other nonvenomous arthropods, initial encounter: Secondary | ICD-10-CM

## 2020-09-23 DIAGNOSIS — L2082 Flexural eczema: Secondary | ICD-10-CM | POA: Diagnosis not present

## 2020-09-23 MED ORDER — METHYLPREDNISOLONE SODIUM SUCC 125 MG IJ SOLR
125.0000 mg | Freq: Once | INTRAMUSCULAR | Status: AC
  Administered 2020-09-23: 125 mg via INTRAMUSCULAR

## 2020-09-23 MED ORDER — TRIAMCINOLONE ACETONIDE 0.5 % EX OINT: 1.0000 "application " | TOPICAL_OINTMENT | Freq: Two times a day (BID) | CUTANEOUS | 1 refills | Status: DC

## 2020-09-23 NOTE — ED Triage Notes (Signed)
Pt presents with possible insect bite to her left knee. Reports that she noticed it x 2 days ago. Area is hard and tender to touch. Patient also began breaking out in a rash on her arms, neck and around her eyes after noticing the area on her knee. Denies any other symptoms.

## 2020-09-23 NOTE — Discharge Instructions (Addendum)
Keep area(s) clean and dry. Apply hot compress / towel for 5-10 minutes 3-5 times daily. Return for worsening pain, redness, swelling, discharge, fever.  Helpful prevention tips: Keep nails short to avoid secondary skin infections. Use new, clean razors when shaving. Avoid antiperspirants - look for deodorants without aluminum. Avoid wearing underwire bras as this can irritate the area further.  

## 2020-09-23 NOTE — ED Provider Notes (Signed)
[name]    CSN: [id number] Arrival date & time: 09/23/20  1902      History   Chief Complaint Chief Complaint  Patient presents with  . Insect Bite    HPI Danielle Gibbs is a 24 y.o. female  Presenting for painful, swelling, redness s/p bug bite on left knee.  States is medial aspect and began 2 days ago.  Unsure what bit her.  Also endorsing eczema on bilateral antecubitals, eyelids.  No fever, arthralgias, myalgias, malaise or fatigue, headaches.  Has not tried nothing for this.  Past Medical History:  Diagnosis Date  . Eczema     There are no problems to display for this patient.   History reviewed. No pertinent surgical history.  OB History    Gravida  1   Para      Term      Preterm      AB      Living        SAB      TAB      Ectopic      Multiple      Live Births               Home Medications    Prior to Admission medications   Medication Sig Start Date End Date Taking? Authorizing Provider  triamcinolone ointment (KENALOG) 0.5 % Apply 1 application topically 2 (two) times daily. 09/23/20   Hall-Potvin, [place], PA-C    Family History Family History  Problem Relation Age of Onset  . Healthy Mother   . Healthy Father     Social History Social History   Tobacco Use  . Smoking status: Never Smoker  . Smokeless tobacco: Never Used  Vaping Use  . Vaping Use: Never used  Substance Use Topics  . Alcohol use: No  . Drug use: No     Allergies   Patient has no known allergies.   Review of Systems As per HPI   Physical Exam Triage Vital Signs ED Triage Vitals  Enc Vitals Group     BP      Pulse      Resp      Temp      Temp src      SpO2      Weight      Height      Head Circumference      Peak Flow      Pain Score      Pain Loc      Pain Edu?      Excl. in [place]?    No data found.  Updated Vital Signs BP 123/82   Pulse 86   Temp 98.6 F (37 C)   Resp 19   LMP 09/09/2020   SpO2 100%    Visual Acuity Right Eye Distance:   Left Eye Distance:   Bilateral Distance:    Right Eye Near:   Left Eye Near:    Bilateral Near:     Physical Exam Constitutional:      General: She is not in acute distress. HENT:     Head: Normocephalic and atraumatic.  Eyes:     General: No scleral icterus.    Pupils: Pupils are equal, round, and reactive to light.  Cardiovascular:     Rate and Rhythm: Normal rate.  Pulmonary:     Effort: Pulmonary effort is normal.  Skin:    Coloration: Skin is not jaundiced or pale.  Findings: Rash present.     Comments: Eczematous dermatitis noted to bilateral upper eyelids, antecubitals.  No tenderness, warmth, discharge. Left medial knee with 2 cm area of erythema.  Mild tenderness.  No open wound.  Neurological:     Mental Status: She is alert and oriented to person, place, and time.      UC Treatments / Results  Labs (all labs ordered are listed, but only abnormal results are displayed) Labs Reviewed - No data to display  EKG   Radiology No results found.  Procedures Procedures (including critical care time)  Medications Ordered in UC Medications  methylPREDNISolone sodium succinate (SOLU-MEDROL) 125 mg/2 mL injection 125 mg (125 mg Intramuscular Given 09/23/20 1953)    Initial Impression / Assessment and Plan / UC Course  I have reviewed the triage vital signs and the nursing notes.  Pertinent labs & imaging results that were available during my care of the patient were reviewed by me and considered in my medical decision making (see chart for details).     We will treat eczema as outlined below.  Given Solu-Medrol as tolerated.  Patient at risk for forming secondary cellulitis, though is immunocompetent.  Will treat supportively as outlined below, watch closely.  Return precautions discussed, pt verbalized understanding and is agreeable to plan. Final Clinical Impressions(s) / UC Diagnoses   Final diagnoses:  Eczematous  dermatitis of upper eyelids of both eyes  Flexural eczema  Bug bite, initial encounter     Discharge Instructions     Keep area(s) clean and dry. Apply hot compress / towel for 5-10 minutes 3-5 times daily. Return for worsening pain, redness, swelling, discharge, fever.  Helpful prevention tips: Keep nails short to avoid secondary skin infections. Use new, clean razors when shaving. Avoid antiperspirants - look for deodorants without aluminum. Avoid wearing underwire bras as this can irritate the area further.     ED Prescriptions    Medication Sig Dispense Auth. Provider   triamcinolone ointment (KENALOG) 0.5 % Apply 1 application topically 2 (two) times daily. 30 g Hall-Potvin, [place], PA-C     PDMP not reviewed this encounter.   Hall-Potvin, [place], [place] 09/23/20 1959

## 2020-12-05 ENCOUNTER — Ambulatory Visit: Payer: Self-pay

## 2020-12-23 ENCOUNTER — Emergency Department
Admission: EM | Admit: 2020-12-23 | Discharge: 2020-12-23 | Discharge status: Against Medical Advice | Payer: Medicaid Other

## 2020-12-23 NOTE — ED Notes (Signed)
No answer when called several times from lobby 

## 2020-12-30 DIAGNOSIS — A609 Anogenital herpesviral infection, unspecified: Secondary | ICD-10-CM | POA: Insufficient documentation

## 2020-12-31 ENCOUNTER — Other Ambulatory Visit: Payer: Medicaid Other

## 2020-12-31 DIAGNOSIS — Z20822 Contact with and (suspected) exposure to covid-19: Secondary | ICD-10-CM

## 2021-01-02 LAB — NOVEL CORONAVIRUS, NAA: SARS-CoV-2, NAA: NOT DETECTED

## 2021-01-02 LAB — SARS-COV-2, NAA 2 DAY TAT

## 2021-01-22 ENCOUNTER — Other Ambulatory Visit: Payer: Medicaid Other

## 2021-01-22 DIAGNOSIS — Z20822 Contact with and (suspected) exposure to covid-19: Secondary | ICD-10-CM

## 2021-01-23 LAB — NOVEL CORONAVIRUS, NAA: SARS-CoV-2, NAA: DETECTED — AB

## 2021-01-23 LAB — SARS-COV-2, NAA 2 DAY TAT

## 2021-01-28 ENCOUNTER — Other Ambulatory Visit: Payer: Medicaid Other

## 2021-01-30 ENCOUNTER — Other Ambulatory Visit: Payer: Medicaid Other

## 2021-01-30 DIAGNOSIS — Z20822 Contact with and (suspected) exposure to covid-19: Secondary | ICD-10-CM

## 2021-01-31 ENCOUNTER — Other Ambulatory Visit: Payer: Medicaid Other

## 2021-02-01 LAB — NOVEL CORONAVIRUS, NAA: SARS-CoV-2, NAA: NOT DETECTED

## 2021-02-01 LAB — SARS-COV-2, NAA 2 DAY TAT

## 2021-02-25 ENCOUNTER — Ambulatory Visit
Admission: EM | Admit: 2021-02-25 | Discharge: 2021-02-25 | Disposition: A | Discharge status: Home / Self Care | Payer: Medicaid Other

## 2021-02-25 DIAGNOSIS — M545 Low back pain, unspecified: Secondary | ICD-10-CM

## 2021-02-25 NOTE — ED Provider Notes (Signed)
[name]    CSN: [id number] Arrival date & time: 02/25/21  1751      History   Chief Complaint Chief Complaint  Patient presents with  . Back Pain    HPI Talena K Leamer is a 25 y.o. female 4 months pregnant presenting today for evaluation of back pain.  Patient reports that she has had pain to her lower back, worse today with sitting down at work.  Denies injury.  Denies associated vaginal bleeding or abdominal pain.  Pain often will be in mid back or upper back.  Denies any increased activity or heavy lifting recently.  Denies radiation into extremities.  HPI  Past Medical History:  Diagnosis Date  . Eczema     There are no problems to display for this patient.   History reviewed. No pertinent surgical history.  OB History    Gravida  2   Para      Term      Preterm      AB      Living        SAB      IAB      Ectopic      Multiple      Live Births               Home Medications    Prior to Admission medications   Medication Sig Start Date End Date Taking? Authorizing Provider  Prenatal Vit-Fe Fumarate-FA (PRENATAL MULTIVITAMIN) TABS tablet Take 1 tablet by mouth daily at 12 noon.   Yes [provider]    Family History Family History  Problem Relation Age of Onset  . Healthy Mother   . Healthy Father     Social History Social History   Tobacco Use  . Smoking status: Never Smoker  . Smokeless tobacco: Never Used  Vaping Use  . Vaping Use: Never used  Substance Use Topics  . Alcohol use: No  . Drug use: No     Allergies   Patient has no known allergies.   Review of Systems Review of Systems  Constitutional: Negative for fatigue and fever.  Eyes: Negative for visual disturbance.  Respiratory: Negative for shortness of breath.   Cardiovascular: Negative for chest pain.  Gastrointestinal: Negative for abdominal pain, nausea and vomiting.  Genitourinary: Negative for difficulty urinating, dysuria and  urgency.  Musculoskeletal: Positive for back pain and myalgias. Negative for arthralgias and joint swelling.  Skin: Negative for color change, rash and wound.  Neurological: Negative for dizziness, weakness, light-headedness and headaches.     Physical Exam Triage Vital Signs ED Triage Vitals [02/25/21 1800]  Enc Vitals Group     BP 118/71     Pulse Rate 98     Resp 18     Temp 97.9 F (36.6 C)     Temp Source Oral     SpO2 97 %     Weight      Height      Head Circumference      Peak Flow      Pain Score 4     Pain Loc      Pain Edu?      Excl. in GC?    No data found.  Updated Vital Signs BP 118/71 (BP Location: Left Arm)   Pulse 98   Temp 97.9 F (36.6 C) (Oral)   Resp 18   LMP 09/09/2020   SpO2 97%   Breastfeeding No   Visual Acuity  Right Eye Distance:   Left Eye Distance:   Bilateral Distance:    Right Eye Near:   Left Eye Near:    Bilateral Near:     Physical Exam Vitals and nursing note reviewed.  Constitutional:      Appearance: She is well-developed and well-nourished.     Comments: No acute distress  HENT:     Head: Normocephalic and atraumatic.     Nose: Nose normal.  Eyes:     Conjunctiva/sclera: Conjunctivae normal.  Cardiovascular:     Rate and Rhythm: Normal rate.  Pulmonary:     Effort: Pulmonary effort is normal. No respiratory distress.  Abdominal:     General: There is no distension.  Musculoskeletal:        General: Normal range of motion.     Cervical back: Neck supple.     Comments: Back: Nontender to palpation of cervical, thoracic or lumbar spine midline, no palpable deformity or step-off, nontender to palpation throughout lumbar thoracic and cervical areas of back  Full active range of motion of upper extremities, strength 5/5 and equal bilaterally at shoulders hips and knees  Skin:    General: Skin is warm and dry.  Neurological:     Mental Status: She is alert and oriented to person, place, and time.  Psychiatric:         Mood and Affect: Mood and affect normal.      UC Treatments / Results  Labs (all labs ordered are listed, but only abnormal results are displayed) Labs Reviewed - No data to display  EKG   Radiology No results found.  Procedures Procedures (including critical care time)  Medications Ordered in UC Medications - No data to display  Initial Impression / Assessment and Plan / UC Course  I have reviewed the triage vital signs and the nursing notes.  Pertinent labs & imaging results that were available during my care of the patient were reviewed by me and considered in my medical decision making (see chart for details).    29-month-pregnant female with back pain without mechanism of injury.  Suspect likely mechanical/muscular in etiology.  Recommending Tylenol, alternating ice and heat and discussed proper lifting mechanics.  May alternate between for greater recommendations for pain. Discussed strict return precautions. Patient verbalized understanding and is agreeable with plan.   Final Clinical Impressions(s) / UC Diagnoses   Final diagnoses:  Acute bilateral low back pain without sciatica     Discharge Instructions     Tylenol 1000 mg every 4-6 hours for back pain    ED Prescriptions    None     PDMP not reviewed this encounter.   Lew Dawes, [place] 02/25/21 1813

## 2021-02-25 NOTE — ED Triage Notes (Signed)
Pt states she is 4 months pregnant and has had aching to lower back for a while. States having lower back pain today while at work sitting down. Denies injury, no vaginal bleeding, and no abdominal pain.

## 2021-02-25 NOTE — Discharge Instructions (Signed)
Tylenol 1000 mg every 4-6 hours for back pain

## 2021-03-16 ENCOUNTER — Other Ambulatory Visit: Payer: Self-pay

## 2021-03-16 ENCOUNTER — Encounter: Payer: Self-pay | Admitting: Emergency Medicine

## 2021-03-16 ENCOUNTER — Emergency Department: Payer: Medicaid Other

## 2021-03-16 ENCOUNTER — Emergency Department
Admission: EM | Admit: 2021-03-16 | Discharge: 2021-03-16 | Disposition: A | Discharge status: Home / Self Care | Payer: Medicaid Other | Attending: Emergency Medicine | Admitting: Emergency Medicine

## 2021-03-16 DIAGNOSIS — R102 Pelvic and perineal pain: Secondary | ICD-10-CM

## 2021-03-16 DIAGNOSIS — O26892 Other specified pregnancy related conditions, second trimester: Secondary | ICD-10-CM

## 2021-03-16 DIAGNOSIS — O4690 Antepartum hemorrhage, unspecified, unspecified trimester: Secondary | ICD-10-CM

## 2021-03-16 DIAGNOSIS — N939 Abnormal uterine and vaginal bleeding, unspecified: Secondary | ICD-10-CM

## 2021-03-16 DIAGNOSIS — Z3A19 19 weeks gestation of pregnancy: Secondary | ICD-10-CM | POA: Insufficient documentation

## 2021-03-16 DIAGNOSIS — O208 Other hemorrhage in early pregnancy: Secondary | ICD-10-CM | POA: Diagnosis present

## 2021-03-16 LAB — URINALYSIS, COMPLETE (UACMP) WITH MICROSCOPIC
Bilirubin Urine: NEGATIVE
Glucose, UA: NEGATIVE mg/dL
Hgb urine dipstick: NEGATIVE
Ketones, ur: NEGATIVE mg/dL
Leukocytes,Ua: NEGATIVE
Nitrite: NEGATIVE
Protein, ur: NEGATIVE mg/dL
Specific Gravity, Urine: 1.017 (ref 1.005–1.030)
pH: 7 (ref 5.0–8.0)

## 2021-03-16 LAB — COMPREHENSIVE METABOLIC PANEL
ALT: 11 U/L (ref 0–44)
AST: 15 U/L (ref 15–41)
Albumin: 3.3 g/dL — ABNORMAL LOW (ref 3.5–5.0)
Alkaline Phosphatase: 50 U/L (ref 38–126)
Anion gap: 7 (ref 5–15)
BUN: 7 mg/dL (ref 6–20)
CO2: 23 mmol/L (ref 22–32)
Calcium: 8.8 mg/dL — ABNORMAL LOW (ref 8.9–10.3)
Chloride: 106 mmol/L (ref 98–111)
Creatinine, Ser: 0.42 mg/dL — ABNORMAL LOW (ref 0.44–1.00)
GFR, Estimated: >60 mL/min (ref >60)
Glucose, Bld: 84 mg/dL (ref 70–99)
Potassium: 3.4 mmol/L — ABNORMAL LOW (ref 3.5–5.1)
Sodium: 136 mmol/L (ref 135–145)
Total Bilirubin: 0.9 mg/dL (ref 0.3–1.2)
Total Protein: 6.9 g/dL (ref 6.5–8.1)

## 2021-03-16 LAB — CBC
HCT: 30.4 % — ABNORMAL LOW (ref 36.0–46.0)
Hemoglobin: 10 g/dL — ABNORMAL LOW (ref 12.0–15.0)
MCH: 30.8 pg (ref 26.0–34.0)
MCHC: 32.9 g/dL (ref 30.0–36.0)
MCV: 93.5 fL (ref 80.0–100.0)
Platelets: 274 10*3/uL (ref 150–400)
RBC: 3.25 MIL/uL — ABNORMAL LOW (ref 3.87–5.11)
RDW: 14.6 % (ref 11.5–15.5)
WBC: 7 10*3/uL (ref 4.0–10.5)
nRBC: 0 % (ref 0.0–0.2)

## 2021-03-16 LAB — LIPASE, BLOOD: Lipase: 31 U/L (ref 11–51)

## 2021-03-16 MED ORDER — ACETAMINOPHEN 500 MG PO TABS
ORAL_TABLET | ORAL | Status: AC
  Filled 2021-03-16: qty 2

## 2021-03-16 MED ORDER — ACETAMINOPHEN 500 MG PO TABS
1000.0000 mg | ORAL_TABLET | Freq: Once | ORAL | Status: AC
  Administered 2021-03-16: 1000 mg via ORAL

## 2021-03-16 NOTE — ED Notes (Signed)
Pt alert and oriented X 4, stable for discharge. RR even and unlabored, color WNL. Discussed discharge instructions and follow-up as directed. Discharge medications discussed if prescribed. Pt had opportunity to ask questions, and RN to provide patient/family eduction.  

## 2021-03-16 NOTE — ED Provider Notes (Signed)
[facility] Emergency Department Provider Note ____________________________________________   Event Date/Time   First MD Initiated Contact with Patient 03/16/21 1126     (approximate)  I have reviewed the triage vital signs and the nursing notes.  HISTORY  Chief Complaint Vaginal Bleeding   HPI Danielle Gibbs is a 25 y.o. femalewho presents to the ED for evaluation of vaginal bleeding.  Chart review indicates G3 P2 at about [redacted] weeks gestation.  Rh+.  Patient presents to the ED for evaluation of vaginal spotting and pressure sensation.  Patient reports symptoms starting at about 4 AM this morning, and she was fine prior to this.  She reports some light spotting without passage of clots or significant continuous bleeding.  She reports this is already subsided.  She reports coinciding lower pelvic/vaginal pressure sensation that is up to 4/10 intensity.  She has not taken any medications for this.  Denies urinary changes, dysuria, hematuria.  Denies vaginal discharge that is new for her.  Denies fever, syncope, trauma.  Denies diarrhea or stool changes.  Reports her last BM was 2 days ago and "normal."   Past Medical History:  Diagnosis Date  . Eczema     There are no problems to display for this patient.   History reviewed. No pertinent surgical history.  Prior to Admission medications   Medication Sig Start Date End Date Taking? Authorizing Provider  Prenatal Vit-Fe Fumarate-FA (PRENATAL MULTIVITAMIN) TABS tablet Take 1 tablet by mouth daily at 12 noon.    [provider]    Allergies Patient has no known allergies.  Family History  Problem Relation Age of Onset  . Healthy Mother   . Healthy Father     Social History Social History   Tobacco Use  . Smoking status: Never Smoker  . Smokeless tobacco: Never Used  Vaping Use  . Vaping Use: Never used  Substance Use Topics  . Alcohol use: No  . Drug use: No    Review of  Systems  Constitutional: No fever/chills Eyes: No visual changes. ENT: No sore throat. Cardiovascular: Denies chest pain. Respiratory: Denies shortness of breath. Gastrointestinal:  No nausea, no vomiting.  No diarrhea.  No constipation. Positive for lower abdominal/pelvic pressure at midline Genitourinary: Negative for dysuria. Positive for vaginal bleeding and pelvic pressure Musculoskeletal: Negative for back pain. Skin: Negative for rash. Neurological: Negative for headaches, focal weakness or numbness.   ____________________________________________   PHYSICAL EXAM:  VITAL SIGNS: Vitals:   03/16/21 1157 03/16/21 1445  BP: 105/71 106/64  Pulse: 75 78  Resp: 16 18  Temp:    SpO2: 99% 98%    Constitutional: Alert and oriented. Well appearing and in no acute distress. Eyes: Conjunctivae are normal. PERRL. EOMI. Head: Atraumatic. Nose: No congestion/rhinnorhea. Mouth/Throat: Mucous membranes are moist.  Oropharynx non-erythematous. Neck: No stridor. No cervical spine tenderness to palpation. Cardiovascular: Normal rate, regular rhythm. Grossly normal heart sounds.  Good peripheral circulation. Respiratory: Normal respiratory effort.  No retractions. Lungs CTAB. Gastrointestinal: Soft , nondistended. No CVA tenderness. Mild and poorly localizing lower abdominal tenderness to palpation, primarily to suprapubic and LLQ abdomen.  Otherwise benign and nontender abdomen. Musculoskeletal: No lower extremity tenderness nor edema.  No joint effusions. No signs of acute trauma. Neurologic:  Normal speech and language. No gross focal neurologic deficits are appreciated. No gait instability noted. Skin:  Skin is warm, dry and intact. No rash noted. Psychiatric: Mood and affect are normal. Speech and behavior are normal.  ____________________________________________  LABS (all labs ordered are listed, but only abnormal results are displayed)  Labs Reviewed  COMPREHENSIVE  METABOLIC PANEL - Abnormal; Notable for the following components:      Result Value   Potassium 3.4 (*)    Creatinine, Ser 0.42 (*)    Calcium 8.8 (*)    Albumin 3.3 (*)    All other components within normal limits  CBC - Abnormal; Notable for the following components:   RBC 3.25 (*)    Hemoglobin 10.0 (*)    HCT 30.4 (*)    All other components within normal limits  URINALYSIS, COMPLETE (UACMP) WITH MICROSCOPIC - Abnormal; Notable for the following components:   Color, Urine YELLOW (*)    APPearance CLOUDY (*)    Bacteria, UA FEW (*)    All other components within normal limits  LIPASE, BLOOD    ____________________________________________  RADIOLOGY  ED MD interpretation:    Official radiology report(s): US OB Limited  Result Date: 03/16/2021 CLINICAL DATA:  Pelvic pain and vaginal spotting. Second trimester pregnancy. EXAM: LIMITED OBSTETRIC ULTRASOUND COMPARISON:  None. FINDINGS: Number of Fetuses: 1 Heart Rate:  153 bpm Movement: Yes Presentation: Cephalic Placental Location: Fundal and posterior Previa: No Amniotic Fluid (Subjective):  Within normal limits. BPD: 4.8 cm 20 w  4 d MATERNAL FINDINGS: Cervix:  Appears closed.  Measures 3.3 cm in length. Uterus/Adnexae: No abnormality visualized. IMPRESSION: Single living intrauterine fetus. No acute maternal findings identified. This exam is performed on an emergent basis and does not comprehensively evaluate fetal size, dating, or anatomy; follow-up complete OB US should be considered if further fetal assessment is warranted. Electronically Signed   By: Danae Orleans M.D.   On: 03/16/2021 14:16    ____________________________________________   PROCEDURES and INTERVENTIONS  Procedure(s) performed (including Critical Care):  Procedures  Medications  acetaminophen (TYLENOL) tablet 1,000 mg (0 mg Oral Not Given 03/16/21 1206)    ____________________________________________   MDM / ED COURSE   25 year old G3, P2 at [redacted]  weeks gestation presents to the ED with acute pelvic pressure/discomfort and vaginal spotting without evidence of acute pathology, and amenable to OB/GYN follow-up.  Normal vitals on room air.  Exam with mild suprapubic tenderness without peritoneal features and her otherwise benign abdomen.  No tenderness to the RLQ at McBurney's point to suggest acute appendicitis.  Blood work demonstrates chronic anemia at baseline without acute derangements.  Urine without evidence of acute cystitis.  Ultrasound demonstrates no maternal or fetal pathology to cause her bleeding, such as abruption.  Patient has resolution of pain after a single dose of Tylenol and I see no barriers to outpatient management at this time.  We discussed following up with OB/GYN and return precautions for the ED were discussed prior to discharge.   Clinical Course as of 03/16/21 1451  Mon Mar 16, 2021  1437 Educated patient of reassuring ultrasound.  We discussed Tylenol.  We discussed following up with OB/GYN and we discussed return precautions for the ED. [DS]    Clinical Course User Index [DS] Delton Prairie, MD    ____________________________________________   FINAL CLINICAL IMPRESSION(S) / ED DIAGNOSES  Final diagnoses:  Vaginal bleeding in pregnancy     ED Discharge Orders    None       Avigdor Dollar Katrinka Blazing   Note:  This document was prepared using Dragon voice recognition software and may include unintentional dictation errors.   Delton Prairie, MD 03/16/21 [phone]

## 2021-03-16 NOTE — Discharge Instructions (Signed)
Use Tylenol for pain and fevers.  Up to 1000 mg per dose, up to 4 times per day.  Do not take more than 4000 mg of Tylenol/acetaminophen within 24 hours..  If you develop any worsening pain despite these medications, worsening bleeding, please return to the ED.  Otherwise follow-up with your OB/GYN as scheduled

## 2021-03-16 NOTE — ED Triage Notes (Signed)
C/O vaginal spotting earlier this morning.  Now also c/o lower abdomianl pain and vaginal pressure.  AAOx3.  Skin warm and dry. NAD  Patient is [redacted] weeks pregnant.  EDC:  08/06/2021.  G3 P2.  Seen by [facility] for OB care.

## 2021-03-16 NOTE — ED Notes (Addendum)
Reports pelvic pain and "pressure on my vagina" that started yesterday; light spotting [redacted] weeks pregnant. FHT found in triage. Denies current spotting, states that it occurred very minimally this morning.

## 2021-04-15 ENCOUNTER — Ambulatory Visit: Payer: Self-pay

## 2021-04-16 ENCOUNTER — Encounter (HOSPITAL_COMMUNITY): Payer: Self-pay | Admitting: Obstetrics and Gynecology

## 2021-04-16 ENCOUNTER — Inpatient Hospital Stay (HOSPITAL_COMMUNITY)
Admission: AD | Admit: 2021-04-16 | Discharge: 2021-04-16 | Disposition: A | Discharge status: Home / Self Care | Payer: Medicaid Other | Attending: Obstetrics and Gynecology | Admitting: Obstetrics and Gynecology

## 2021-04-16 ENCOUNTER — Other Ambulatory Visit: Payer: Self-pay

## 2021-04-16 DIAGNOSIS — O99891 Other specified diseases and conditions complicating pregnancy: Secondary | ICD-10-CM | POA: Diagnosis not present

## 2021-04-16 DIAGNOSIS — Z3A24 24 weeks gestation of pregnancy: Secondary | ICD-10-CM | POA: Diagnosis not present

## 2021-04-16 DIAGNOSIS — O23592 Infection of other part of genital tract in pregnancy, second trimester: Secondary | ICD-10-CM

## 2021-04-16 DIAGNOSIS — N898 Other specified noninflammatory disorders of vagina: Secondary | ICD-10-CM | POA: Insufficient documentation

## 2021-04-16 DIAGNOSIS — M545 Low back pain, unspecified: Secondary | ICD-10-CM | POA: Diagnosis not present

## 2021-04-16 DIAGNOSIS — O26892 Other specified pregnancy related conditions, second trimester: Secondary | ICD-10-CM | POA: Diagnosis present

## 2021-04-16 DIAGNOSIS — M549 Dorsalgia, unspecified: Secondary | ICD-10-CM | POA: Diagnosis not present

## 2021-04-16 DIAGNOSIS — Z79899 Other long term (current) drug therapy: Secondary | ICD-10-CM | POA: Diagnosis not present

## 2021-04-16 LAB — URINALYSIS, ROUTINE W REFLEX MICROSCOPIC
Bilirubin Urine: NEGATIVE
Glucose, UA: NEGATIVE mg/dL
Hgb urine dipstick: NEGATIVE
Ketones, ur: 20 mg/dL — AB
Nitrite: NEGATIVE
Protein, ur: 30 mg/dL — AB
Specific Gravity, Urine: 1.028 (ref 1.005–1.030)
pH: 5 (ref 5.0–8.0)

## 2021-04-16 LAB — GC/CHLAMYDIA PROBE AMP (~~LOC~~) NOT AT ARMC
Chlamydia: NEGATIVE
Comment: NEGATIVE
Comment: NORMAL
Neisseria Gonorrhea: NEGATIVE

## 2021-04-16 LAB — WET PREP, GENITAL
Sperm: NONE SEEN
Trich, Wet Prep: NONE SEEN
Yeast Wet Prep HPF POC: NONE SEEN

## 2021-04-16 LAB — POCT FERN TEST: POCT Fern Test: NEGATIVE

## 2021-04-16 MED ORDER — CYCLOBENZAPRINE HCL 5 MG PO TABS
10.0000 mg | ORAL_TABLET | Freq: Three times a day (TID) | ORAL | Status: DC | PRN
  Administered 2021-04-16: 10 mg via ORAL
  Filled 2021-04-16: qty 2

## 2021-04-16 MED ORDER — ACETAMINOPHEN 500 MG PO TABS
1000.0000 mg | ORAL_TABLET | Freq: Four times a day (QID) | ORAL | Status: DC | PRN
  Administered 2021-04-16: 1000 mg via ORAL
  Filled 2021-04-16: qty 2

## 2021-04-16 NOTE — MAU Note (Signed)
Danielle Gibbs is a 25 y.o. at [redacted]w[redacted]d here in MAU reporting: woke up this AM with back pain and abdominal tightening. And then when using the bathroom she noticed a gush of clear fluid. No bleeding. +FM  Onset of complaint: today  Pain score: 10/10  Vitals:   04/16/21 1118  BP: 111/67  Pulse: 84  Resp: 16  Temp: 98.2 F (36.8 C)  SpO2: 100%     FHT:150  Lab orders placed from triage: UA

## 2021-04-16 NOTE — Discharge Instructions (Signed)
Back Pain in Pregnancy Back pain during pregnancy is common. Back pain may be caused by several factors that are related to changes during your pregnancy. Follow these instructions at home: Managing pain, stiffness, and swelling  If directed, for sudden (acute) back pain, put ice on the painful area. ? Put ice in a plastic bag. ? Place a towel between your skin and the bag. ? Leave the ice on for 20 minutes, 2-3 times per day.  If directed, apply heat to the affected area before you exercise. Use the heat source that your health care provider recommends, such as a moist heat pack or a heating pad. ? Place a towel between your skin and the heat source. ? Leave the heat on for 20-30 minutes. ? Remove the heat if your skin turns bright red. This is especially important if you are unable to feel pain, heat, or cold. You may have a greater risk of getting burned.  If directed, massage the affected area.      Activity  Exercise as told by your health care provider. Gentle exercise is the best way to prevent or manage back pain.  Listen to your body when lifting. If lifting hurts, ask for help or bend your knees. This uses your leg muscles instead of your back muscles.  Squat down when picking up something from the floor. Do not bend over.  Only use bed rest for short periods as told by your health care provider. Bed rest should only be used for the most severe episodes of back pain. Standing, sitting, and lying down  Do not stand in one place for long periods of time.  Use good posture when sitting. Make sure your head rests over your shoulders and is not hanging forward. Use a pillow on your lower back if necessary.  Try sleeping on your side, preferably the left side, with a pregnancy support pillow or 1-2 regular pillows between your legs. ? If you have back pain after a night's rest, your bed may be too soft. ? A firm mattress may provide more support for your back during  pregnancy. General instructions  Do not wear high heels.  Eat a healthy diet. Try to gain weight within your health care provider's recommendations.  Use a maternity girdle, elastic sling, or back brace as told by your health care provider.  Take over-the-counter and prescription medicines only as told by your health care provider.  Work with a physical therapist or massage therapist to find ways to manage back pain. Acupuncture or massage therapy may be helpful.  Keep all follow-up visits as told by your health care provider. This is important. Contact a health care provider if:  Your back pain interferes with your daily activities.  You have increasing pain in other parts of your body. Get help right away if:  You develop numbness, tingling, weakness, or problems with the use of your arms or legs.  You develop severe back pain that is not controlled with medicine.  You have a change in bowel or bladder control.  You develop shortness of breath, dizziness, or you faint.  You develop nausea, vomiting, or sweating.  You have back pain that is a rhythmic, cramping pain similar to labor pains. Labor pain is usually 1-2 minutes apart, lasts for about 1 minute, and involves a bearing down feeling or pressure in your pelvis.  You have back pain and your water breaks or you have vaginal bleeding.  You have back pain or   numbness that travels down your leg.  Your back pain developed after you fell.  You develop pain on one side of your back.  You see blood in your urine.  You develop skin blisters in the area of your back pain. Summary  Back pain may be caused by several factors that are related to changes during your pregnancy.  Follow instructions as told by your health care provider for managing pain, stiffness, and swelling.  Exercise as told by your health care provider. Gentle exercise is the best way to prevent or manage back pain.  Take over-the-counter and  prescription medicines only as told by your health care provider.  Keep all follow-up visits as told by your health care provider. This is important. This information is not intended to replace advice given to you by your health care provider. Make sure you discuss any questions you have with your health care provider. Document Revised: 03/27/2019 Document Reviewed: 05/24/2018 Elsevier Patient Education  2021 [employer]vier Inc.  

## 2021-04-16 NOTE — MAU Provider Note (Signed)
History     CSN: [id number]  Arrival date and time: 04/16/21 1034   Event Date/Time   First Provider Initiated Contact with Patient 04/16/21 1319      Chief Complaint  Patient presents with  . Back Pain  . Rupture of Membranes   25 y.o. [id number] @24 .0 wks presenting with LOF, LBP, and abdominal tightening. Pain started this am. Reports abd tightening q4 min, she thinks may be labor. LBP is bilateral and lower w/o laterality. Reports gush of mucousy watery fluid around 0900 today. No leaking since. Denies itching or malodor. No recent IC. She also reports daily watery diarrhea 2-4 times a day since she had a stomach bug about 3 weeks ago. No malodor. She is drinking well but has poor appetite. Reports good FM.   OB History    Gravida  3   Para  2   Term  2   Preterm      AB      Living  2     SAB      IAB      Ectopic      Multiple      Live Births  2           Past Medical History:  Diagnosis Date  . Eczema     History reviewed. No pertinent surgical history.  Family History  Problem Relation Age of Onset  . Healthy Mother   . Healthy Father     Social History   Tobacco Use  . Smoking status: Never Smoker  . Smokeless tobacco: Never Used  Vaping Use  . Vaping Use: Never used  Substance Use Topics  . Alcohol use: No  . Drug use: No    Allergies: No Known Allergies  Medications Prior to Admission  Medication Sig Dispense Refill Last Dose  . Prenatal Vit-Fe Fumarate-FA (PRENATAL MULTIVITAMIN) TABS tablet Take 1 tablet by mouth daily at 12 noon.       Review of Systems  Constitutional: Negative for chills and fever.  Gastrointestinal: Positive for abdominal pain and diarrhea. Negative for constipation.  Genitourinary: Positive for vaginal discharge. Negative for dysuria, hematuria, urgency and vaginal bleeding.  Musculoskeletal: Positive for back pain.   Physical Exam   Blood pressure 111/67, pulse 84, temperature 98.2 F (36.8 C),  temperature source Oral, resp. rate 16, height 5\' 7"  (1.702 m), weight 66 kg, last menstrual period 09/09/2020, SpO2 100 %.  Physical Exam Vitals and nursing note reviewed. Exam conducted with a chaperone present.  Constitutional:      General: She is not in acute distress.    Appearance: Normal appearance.  HENT:     Head: Normocephalic and atraumatic.  Cardiovascular:     Rate and Rhythm: Normal rate.  Pulmonary:     Effort: Pulmonary effort is normal. No respiratory distress.  Abdominal:     Palpations: Abdomen is soft.     Tenderness: There is no abdominal tenderness. There is no right CVA tenderness or left CVA tenderness.     Comments: gravid  Genitourinary:    Comments: SSE: no pool, fern neg SVE: closed/thick Musculoskeletal:        General: Normal range of motion.     Cervical back: Normal range of motion.     Thoracic back: Normal.     Lumbar back: Normal.  Skin:    General: Skin is warm and dry.  Neurological:     General: No focal deficit present.     Mental  Status: She is alert and oriented to person, place, and time.  Psychiatric:        Mood and Affect: Mood normal.        Behavior: Behavior normal.   EFM: 145 bpm, mod variability, + accels, rare variable decels Toco: none  Results for orders placed or performed during the hospital encounter of 04/16/21 (from the past 24 hour(s))  Wet prep, genital     Status: Abnormal   Collection Time: 04/16/21  2:07 PM  Result Value Ref Range   Yeast Wet Prep HPF POC NONE SEEN NONE SEEN   Trich, Wet Prep NONE SEEN NONE SEEN   Clue Cells Wet Prep HPF POC PRESENT (A) NONE SEEN   WBC, Wet Prep HPF POC MANY (A) NONE SEEN   Sperm NONE SEEN   Fern Test     Status: None   Collection Time: 04/16/21  2:46 PM  Result Value Ref Range   POCT Fern Test Negative = intact amniotic membranes    MAU Course  Procedures Flexeril Tylenol  MDM Labs ordered and reviewed. No signs of PTL or SROM. Will add UC, although UA likely  contaminated. Did not have BM while here, plans to f/u with PCP for diarrhea. Just prior to d/c 2 FHR decels noted (1 variable, 1 prolonged. Consult with Dr. Vergie Living, will watch for an hr and if no further decels ok for discharge. 1730: FHT with 145 baseline, + accels and just prior to EFM removal one variable decel noted. Consult with Dr. Jolayne Panther, reviewed tracing, ok for discharge home.   Assessment and Plan   1. [redacted] weeks gestation of pregnancy   2. Vaginal discharge during pregnancy in second trimester   3. Back pain affecting pregnancy in second trimester    Discharge home Follow up at [facility] as scheduled Return for PTL sx or decreased FM  Allergies as of 04/16/2021   No Known Allergies     Medication List    TAKE these medications   prenatal multivitamin Tabs tablet Take 1 tablet by mouth daily at 12 noon.      Donette Larry, CNM 04/16/2021, 2:47 PM

## 2021-04-17 LAB — CULTURE, OB URINE: Culture: <10000 — AB

## 2021-08-19 DIAGNOSIS — Z30011 Encounter for initial prescription of contraceptive pills: Secondary | ICD-10-CM | POA: Insufficient documentation

## 2021-11-05 ENCOUNTER — Emergency Department (HOSPITAL_COMMUNITY): Payer: Medicaid Other

## 2021-11-05 ENCOUNTER — Encounter (HOSPITAL_COMMUNITY): Payer: Self-pay | Admitting: Emergency Medicine

## 2021-11-05 ENCOUNTER — Emergency Department (HOSPITAL_COMMUNITY)
Admission: EM | Admit: 2021-11-05 | Discharge: 2021-11-05 | Disposition: A | Discharge status: Home / Self Care | Payer: Medicaid Other | Attending: Emergency Medicine | Admitting: Emergency Medicine

## 2021-11-05 DIAGNOSIS — S299XXA Unspecified injury of thorax, initial encounter: Secondary | ICD-10-CM | POA: Insufficient documentation

## 2021-11-05 DIAGNOSIS — O99280 Endocrine, nutritional and metabolic diseases complicating pregnancy, unspecified trimester: Secondary | ICD-10-CM | POA: Diagnosis not present

## 2021-11-05 DIAGNOSIS — S199XXA Unspecified injury of neck, initial encounter: Secondary | ICD-10-CM | POA: Diagnosis not present

## 2021-11-05 DIAGNOSIS — Y9389 Activity, other specified: Secondary | ICD-10-CM | POA: Diagnosis not present

## 2021-11-05 DIAGNOSIS — E871 Hypo-osmolality and hyponatremia: Secondary | ICD-10-CM | POA: Insufficient documentation

## 2021-11-05 DIAGNOSIS — S8992XA Unspecified injury of left lower leg, initial encounter: Secondary | ICD-10-CM | POA: Insufficient documentation

## 2021-11-05 DIAGNOSIS — O26899 Other specified pregnancy related conditions, unspecified trimester: Secondary | ICD-10-CM | POA: Diagnosis not present

## 2021-11-05 DIAGNOSIS — N9489 Other specified conditions associated with female genital organs and menstrual cycle: Secondary | ICD-10-CM | POA: Diagnosis not present

## 2021-11-05 DIAGNOSIS — O9A219 Injury, poisoning and certain other consequences of external causes complicating pregnancy, unspecified trimester: Secondary | ICD-10-CM | POA: Insufficient documentation

## 2021-11-05 DIAGNOSIS — Y92410 Unspecified street and highway as the place of occurrence of the external cause: Secondary | ICD-10-CM | POA: Insufficient documentation

## 2021-11-05 DIAGNOSIS — Z3A00 Weeks of gestation of pregnancy not specified: Secondary | ICD-10-CM | POA: Insufficient documentation

## 2021-11-05 DIAGNOSIS — S3992XA Unspecified injury of lower back, initial encounter: Secondary | ICD-10-CM | POA: Insufficient documentation

## 2021-11-05 LAB — COMPREHENSIVE METABOLIC PANEL
ALT: 11 U/L (ref 0–44)
AST: 24 U/L (ref 15–41)
Albumin: 3.7 g/dL (ref 3.5–5.0)
Alkaline Phosphatase: 58 U/L (ref 38–126)
Anion gap: 12 (ref 5–15)
BUN: 6 mg/dL (ref 6–20)
CO2: 20 mmol/L — ABNORMAL LOW (ref 22–32)
Calcium: 8.9 mg/dL (ref 8.9–10.3)
Chloride: 100 mmol/L (ref 98–111)
Creatinine, Ser: 0.51 mg/dL (ref 0.44–1.00)
GFR, Estimated: >60 mL/min (ref >60)
Glucose, Bld: 85 mg/dL (ref 70–99)
Potassium: 4.3 mmol/L (ref 3.5–5.1)
Sodium: 132 mmol/L — ABNORMAL LOW (ref 135–145)
Total Bilirubin: 1.1 mg/dL (ref 0.3–1.2)
Total Protein: 7.1 g/dL (ref 6.5–8.1)

## 2021-11-05 LAB — CBC WITH DIFFERENTIAL/PLATELET
Abs Immature Granulocytes: 0.02 10*3/uL (ref 0.00–0.07)
Basophils Absolute: 0.1 10*3/uL (ref 0.0–0.1)
Basophils Relative: 1 %
Eosinophils Absolute: 0.1 10*3/uL (ref 0.0–0.5)
Eosinophils Relative: 1 %
HCT: 38.1 % (ref 36.0–46.0)
Hemoglobin: 12.2 g/dL (ref 12.0–15.0)
Immature Granulocytes: 0 %
Lymphocytes Relative: 21 %
Lymphs Abs: 1.5 10*3/uL (ref 0.7–4.0)
MCH: 30.3 pg (ref 26.0–34.0)
MCHC: 32 g/dL (ref 30.0–36.0)
MCV: 94.8 fL (ref 80.0–100.0)
Monocytes Absolute: 0.5 10*3/uL (ref 0.1–1.0)
Monocytes Relative: 7 %
Neutro Abs: 5.1 10*3/uL (ref 1.7–7.7)
Neutrophils Relative %: 70 %
Platelets: 360 10*3/uL (ref 150–400)
RBC: 4.02 MIL/uL (ref 3.87–5.11)
RDW: 13.1 % (ref 11.5–15.5)
WBC: 7.3 10*3/uL (ref 4.0–10.5)
nRBC: 0 % (ref 0.0–0.2)

## 2021-11-05 LAB — I-STAT BETA HCG BLOOD, ED (MC, WL, AP ONLY): I-stat hCG, quantitative: >2000 m[IU]/mL — ABNORMAL HIGH (ref <5)

## 2021-11-05 MED ORDER — ACETAMINOPHEN 325 MG PO TABS
650.0000 mg | ORAL_TABLET | Freq: Once | ORAL | Status: AC
  Administered 2021-11-05: 21:00:00 650 mg via ORAL
  Filled 2021-11-05: qty 2

## 2021-11-05 MED ORDER — PRENATAL MULTIVITAMIN CH: 1.0000 | ORAL_TABLET | Freq: Every day | ORAL | 0 refills | Status: AC
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
## 2022-03-17 DIAGNOSIS — F419 Anxiety disorder, unspecified: Secondary | ICD-10-CM | POA: Insufficient documentation

## 2022-06-29 ENCOUNTER — Ambulatory Visit (INDEPENDENT_AMBULATORY_CARE_PROVIDER_SITE_OTHER): Payer: Medicaid Other

## 2022-06-29 ENCOUNTER — Ambulatory Visit
Admission: EM | Admit: 2022-06-29 | Discharge: 2022-06-29 | Disposition: A | Discharge status: Home / Self Care | Payer: Medicaid Other | Attending: Internal Medicine | Admitting: Internal Medicine

## 2022-06-29 DIAGNOSIS — M25512 Pain in left shoulder: Secondary | ICD-10-CM

## 2022-06-29 DIAGNOSIS — W19XXXA Unspecified fall, initial encounter: Secondary | ICD-10-CM
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
## 2022-06-29 NOTE — ED Triage Notes (Signed)
Pt presents with left shoulder injury after having a fall in her bathroom 2 days ago.
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
## 2022-12-12 ENCOUNTER — Ambulatory Visit
Admission: EM | Admit: 2022-12-12 | Discharge: 2022-12-12 | Discharge status: Against Medical Advice | Payer: Medicaid Other

## 2023-02-02 DIAGNOSIS — N393 Stress incontinence (female) (male): Secondary | ICD-10-CM | POA: Insufficient documentation

## 2023-04-25 ENCOUNTER — Other Ambulatory Visit: Payer: Self-pay

## 2023-04-25 ENCOUNTER — Encounter (HOSPITAL_BASED_OUTPATIENT_CLINIC_OR_DEPARTMENT_OTHER): Payer: Self-pay

## 2023-04-25 ENCOUNTER — Other Ambulatory Visit (HOSPITAL_BASED_OUTPATIENT_CLINIC_OR_DEPARTMENT_OTHER): Payer: Self-pay

## 2023-04-25 ENCOUNTER — Emergency Department (HOSPITAL_BASED_OUTPATIENT_CLINIC_OR_DEPARTMENT_OTHER): Payer: Medicaid Other | Admitting: Radiology

## 2023-04-25 ENCOUNTER — Emergency Department (HOSPITAL_BASED_OUTPATIENT_CLINIC_OR_DEPARTMENT_OTHER)
Admission: EM | Admit: 2023-04-25 | Discharge: 2023-04-25 | Disposition: A | Discharge status: Home / Self Care | Payer: Medicaid Other | Attending: Emergency Medicine | Admitting: Emergency Medicine

## 2023-04-25 DIAGNOSIS — K5900 Constipation, unspecified: Secondary | ICD-10-CM | POA: Diagnosis present

## 2023-04-25 DIAGNOSIS — N920 Excessive and frequent menstruation with regular cycle: Secondary | ICD-10-CM | POA: Diagnosis not present

## 2023-04-25 LAB — URINALYSIS, ROUTINE W REFLEX MICROSCOPIC
Bacteria, UA: NONE SEEN
Bilirubin Urine: NEGATIVE
Glucose, UA: NEGATIVE mg/dL
Ketones, ur: NEGATIVE mg/dL
Leukocytes,Ua: NEGATIVE
Nitrite: NEGATIVE
Protein, ur: NEGATIVE mg/dL
Specific Gravity, Urine: 1.026 (ref 1.005–1.030)
pH: 6.5 (ref 5.0–8.0)

## 2023-04-25 LAB — WET PREP, GENITAL
Sperm: NONE SEEN
Trich, Wet Prep: NONE SEEN
WBC, Wet Prep HPF POC: <10 (ref <10)
Yeast Wet Prep HPF POC: NONE SEEN

## 2023-04-25 LAB — PREGNANCY, URINE: Preg Test, Ur: NEGATIVE

## 2023-04-25 NOTE — ED Triage Notes (Signed)
Patient here POV from Home.  Endorses Vaginal bleeding over the past few days. Currently having Menstrual Cycle. Some Constipation and Back pain noted. Began to have Vaginal Pain/Pressure yesterday PM.  No fever. Some nausea. No emesis.   NAD Noted during Triage. A&Ox4. GCS 15. Ambulatory.

## 2023-04-25 NOTE — ED Provider Notes (Signed)
[place] EMERGENCY DEPARTMENT AT [facility] Provider Note   CSN: [id number] Arrival date & time: 04/25/23  1308     History  Chief Complaint  Patient presents with   Vaginal Bleeding    Danielle Gibbs is a 27 y.o. female.  Patient is a 27 year old G3, P3 who is presenting today with complaints of vaginal bleeding and constipation.  Patient states that bleeding started on Friday and was her typical spotting and even lighter than usual and then yesterday had really heavy bleeding last night and this morning is now been lighter again.  She had a lot of pressure in her pelvic area as well as cramping and pain in the pelvis and in the back.  She has not had any urinary complaints reports she does have an IUD but typically has regular periods.  She has been with her current sexual partner for 3 years and uses protection most of the time.  She has had no discharge burning or itching in the pelvic area.  She also reports 2 days of constipation.  Normally she has a bowel movement up to 2 times per day but has not had anything in the last 2 days.  She has had no recent travel or dietary changes.  No current medications.  The history is provided by the patient.  Vaginal Bleeding      Home Medications Prior to Admission medications   Not on File      Allergies    Patient has no known allergies.    Review of Systems   Review of Systems  Genitourinary:  Positive for vaginal bleeding.    Physical Exam Updated Vital Signs BP (!) 138/91 (BP Location: Right Arm)   Pulse 75   Temp 98.3 F (36.8 C) (Oral)   Resp 18   Ht 5\' 7"  (1.702 m)   Wt 76.7 kg   SpO2 98%   BMI 26.47 kg/m  Physical Exam Vitals and nursing note reviewed. Exam conducted with a chaperone present.  Constitutional:      General: She is not in acute distress.    Appearance: She is well-developed.  HENT:     Head: Normocephalic and atraumatic.  Eyes:     Pupils: Pupils are equal, round, and reactive to  light.  Cardiovascular:     Rate and Rhythm: Normal rate and regular rhythm.     Heart sounds: Normal heart sounds. No murmur heard.    No friction rub.  Pulmonary:     Effort: Pulmonary effort is normal.     Breath sounds: Normal breath sounds. No wheezing or rales.  Abdominal:     General: Bowel sounds are normal. There is no distension.     Palpations: Abdomen is soft.     Tenderness: There is abdominal tenderness in the suprapubic area. There is no right CVA tenderness, left CVA tenderness, guarding or rebound.  Genitourinary:    Vagina: Bleeding present.     Cervix: No cervical motion tenderness.     Uterus: Normal.      Adnexa: Right adnexa normal and left adnexa normal.     Comments: Scant blood noted in the vaginal vault.  No bleeding at the os.  IUD strings are visualized Musculoskeletal:        General: No tenderness. Normal range of motion.     Comments: No edema  Skin:    General: Skin is warm and dry.     Findings: No rash.  Neurological:  Mental Status: She is alert and oriented to person, place, and time. Mental status is at baseline.     Cranial Nerves: No cranial nerve deficit.  Psychiatric:        Behavior: Behavior normal.     ED Results / Procedures / Treatments   Labs (all labs ordered are listed, but only abnormal results are displayed) Labs Reviewed  WET PREP, GENITAL - Abnormal; Notable for the following components:      Result Value   Clue Cells Wet Prep HPF POC PRESENT (*)    All other components within normal limits  URINALYSIS, ROUTINE W REFLEX MICROSCOPIC - Abnormal; Notable for the following components:   Hgb urine dipstick LARGE (*)    All other components within normal limits  PREGNANCY, URINE  GC/CHLAMYDIA PROBE AMP ([place]) NOT AT [facility]    EKG None  Radiology DG Abdomen 1 View  Result Date: 04/25/2023 CLINICAL DATA:  Constipation EXAM: ABDOMEN - 1 VIEW COMPARISON:  None Available. FINDINGS: The bowel gas pattern is normal.  No radio-opaque calculi or other significant radiographic abnormality are seen. IUD in place. Large, image stool burden. IMPRESSION: Large colonic stool burden. Electronically Signed   By: Lorenza Cambridge M.D.   On: 04/25/2023 14:46    Procedures Procedures    Medications Ordered in ED Medications - No data to display  ED Course/ Medical Decision Making/ A&P                             Medical Decision Making Amount and/or Complexity of Data Reviewed Labs: ordered. Decision-making details documented in ED Course. Radiology: ordered and independent interpretation performed. Decision-making details documented in ED Course.   Patient here today complaining of vaginal bleeding and constipation.  Patient had heavy bleeding yesterday but has now improved today.  She has only used 2 pads since this morning.  On exam only scant blood present and IUD appears to be in the correct location.  She has no pelvic tenderness on exam concerning for PID or tubo-ovarian abscess.  She has some mild suprapubic discomfort but no other focal abdominal findings concerning for perforation, appendicitis, cholecystitis or pancreatitis.  Patient has been constipated for the last few days but is not having symptoms consistent with obstruction.  She has not tried anything for this.  I independently interpreted her labs and UA is normal except for blood which is most likely contaminant, urine pregnancy test is negative.  Wet prep with clue cells but o/w wnl. I have independently visualized and interpreted pt's images today.  KUB with signs large stool burden but radiology reports IUD in place.  GC and Chlamydia are sent however at this time low suspicion for STIs.  Will have patient follow-up with OB/GYN if she has any further heavy bleeding.  Recommended prune juice and laxative as needed for constipation.  At this time no indication for further imaging.  Will discharge home.         Final Clinical Impression(s) / ED  Diagnoses Final diagnoses:  Menorrhagia with regular cycle  Constipation, unspecified constipation type    Rx / DC Orders ED Discharge Orders     None         Gwyneth Sprout, MD 04/25/23 [phone]

## 2023-04-25 NOTE — Discharge Instructions (Addendum)
Today your urine test looked good, swab here was good but you do have a culture pending which will come back in the next few days and someone will contact you if it is abnormal.  You are constipated today and you can try prune juice but if that does not work you can try stool softener over-the-counter such as Dulcolax, Colace or you can try MiraLAX which should help relieve the constipation.  Your IUD looks like it is in the appropriate position.  However if you start having recurrent really heavy bleeding, vomiting, fever or other concerns return to the ER.

## 2023-04-26 LAB — GC/CHLAMYDIA PROBE AMP (~~LOC~~) NOT AT ARMC
Chlamydia: NEGATIVE
Comment: NEGATIVE
Comment: NORMAL
Neisseria Gonorrhea: NEGATIVE

## 2023-10-21 ENCOUNTER — Encounter: Payer: Self-pay | Admitting: *Deleted

## 2023-10-21 ENCOUNTER — Ambulatory Visit
Admission: EM | Admit: 2023-10-21 | Discharge: 2023-10-21 | Disposition: A | Discharge status: Home / Self Care | Payer: Medicaid Other | Attending: Internal Medicine | Admitting: Internal Medicine

## 2023-10-21 ENCOUNTER — Ambulatory Visit: Payer: Self-pay

## 2023-10-21 ENCOUNTER — Other Ambulatory Visit: Payer: Self-pay

## 2023-10-21 ENCOUNTER — Ambulatory Visit: Payer: Medicaid Other

## 2023-10-21 DIAGNOSIS — S81012A Laceration without foreign body, left knee, initial encounter: Secondary | ICD-10-CM | POA: Diagnosis not present

## 2023-10-21 DIAGNOSIS — M25562 Pain in left knee: Secondary | ICD-10-CM

## 2023-10-21 DIAGNOSIS — R059 Cough, unspecified: Secondary | ICD-10-CM

## 2023-10-21 DIAGNOSIS — R053 Chronic cough: Secondary | ICD-10-CM | POA: Diagnosis not present

## 2023-10-21 DIAGNOSIS — W19XXXA Unspecified fall, initial encounter: Secondary | ICD-10-CM

## 2023-10-21 MED ORDER — AZITHROMYCIN 250 MG PO TABS: ORAL_TABLET | ORAL | 0 refills | Status: AC

## 2023-10-21 MED ORDER — PREDNISONE 20 MG PO TABS: 40.0000 mg | ORAL_TABLET | Freq: Every day | ORAL | 0 refills | Status: AC

## 2023-10-21 MED ORDER — BENZONATATE 100 MG PO CAPS: 100.0000 mg | ORAL_CAPSULE | Freq: Three times a day (TID) | ORAL | 0 refills | Status: DC | PRN

## 2023-10-21 NOTE — ED Provider Notes (Signed)
[name]    CSN: [id number] Arrival date & time: 10/21/23  1753      History   Chief Complaint Chief Complaint  Patient presents with   Cough   Extremity Laceration    HPI Danielle Gibbs is a 27 y.o. female.   Patient presents with 2 different chief complaints today.  Reports that she has had a productive cough for about 1 to 2 weeks.  Also has some mild nasal congestion.  Children have had similar symptoms.  She denies any recent fevers.  Reports that she has a history of asthma and has been using her albuterol inhaler intermittently due to shortness of breath with very minimal improvement.  Patient also reporting a laceration to her left knee that occurred last night around 8 PM.  Reports that they were running from a dog while trick-or-treating when she fell onto some gravel landing directly on her knee.  Denies hitting head or losing consciousness.  Last tetanus vaccination was in 2020.  She did wash the laceration.  She does not take any blood thinning medications.  Reports it has been intermittently bleeding.   Cough   Past Medical History:  Diagnosis Date   Eczema     There are no problems to display for this patient.   History reviewed. No pertinent surgical history.  OB History     Gravida  3   Para  2   Term  2   Preterm      AB      Living  2      SAB      IAB      Ectopic      Multiple      Live Births  2            Home Medications    Prior to Admission medications   Medication Sig Start Date End Date Taking? Authorizing Provider  azithromycin (ZITHROMAX Z-PAK) 250 MG tablet Take 2 tablets (500 mg total) by mouth daily for 1 day, THEN 1 tablet (250 mg total) daily for 4 days. 10/21/23 10/26/23 Yes Dale Ribeiro, Acie Fredrickson, FNP  benzonatate (TESSALON) 100 MG capsule Take 1 capsule (100 mg total) by mouth every 8 (eight) hours as needed for cough. 10/21/23  Yes Julanne Schlueter, Rolly Salter E, FNP  predniSONE (DELTASONE) 20 MG tablet Take 2  tablets (40 mg total) by mouth daily for 5 days. 10/21/23 10/26/23 Yes Hiliary Osorto, Acie Fredrickson, FNP  amoxicillin (AMOXIL) 500 MG capsule Take 2 capsules (1,000 mg total) by mouth 3 (three) times daily for 5 days. 10/22/23 10/27/23  Gustavus Bryant, FNP    Family History Family History  Problem Relation Age of Onset   Healthy Mother    Healthy Father     Social History Social History   Tobacco Use   Smoking status: Never    Passive exposure: Never   Smokeless tobacco: Never  Vaping Use   Vaping status: Never Used  Substance Use Topics   Alcohol use: No   Drug use: No     Allergies   Patient has no known allergies.   Review of Systems Review of Systems Per HPI  Physical Exam Triage Vital Signs ED Triage Vitals  Encounter Vitals Group     BP 10/21/23 1820 115/76     Systolic BP Percentile --      Diastolic BP Percentile --      Pulse Rate 10/21/23 1820 (!) 113     Resp 10/21/23  1820 18     Temp 10/21/23 1820 98.6 F (37 C)     Temp Source 10/21/23 1820 Oral     SpO2 10/21/23 1820 97 %     Weight --      Height --      Head Circumference --      Peak Flow --      Pain Score 10/21/23 1823 6     Pain Loc --      Pain Education --      Exclude from Growth Chart --    No data found.  Updated Vital Signs BP 115/76 (BP Location: Left Arm)   Pulse (!) 113   Temp 98.6 F (37 C) (Oral)   Resp 18   SpO2 97%   Visual Acuity Right Eye Distance:   Left Eye Distance:   Bilateral Distance:    Right Eye Near:   Left Eye Near:    Bilateral Near:     Physical Exam Constitutional:      General: She is not in acute distress.    Appearance: Normal appearance. She is not toxic-appearing or diaphoretic.  HENT:     Head: Normocephalic and atraumatic.     Right Ear: Tympanic membrane and ear canal normal.     Left Ear: Tympanic membrane and ear canal normal.     Nose: Congestion present.     Mouth/Throat:     Mouth: Mucous membranes are moist.     Pharynx: No posterior  oropharyngeal erythema.  Eyes:     Extraocular Movements: Extraocular movements intact.     Conjunctiva/sclera: Conjunctivae normal.     Pupils: Pupils are equal, round, and reactive to light.  Cardiovascular:     Rate and Rhythm: Normal rate and regular rhythm.     Pulses: Normal pulses.     Heart sounds: Normal heart sounds.  Pulmonary:     Effort: Pulmonary effort is normal. No respiratory distress.     Breath sounds: Normal breath sounds. No stridor. No wheezing, rhonchi or rales.  Abdominal:     General: Abdomen is flat. Bowel sounds are normal.     Palpations: Abdomen is soft.  Musculoskeletal:        General: Normal range of motion.     Cervical back: Normal range of motion.  Skin:    General: Skin is warm and dry.     Comments: Patient has superficial curvilinear laceration present to left anterior knee that is approximately 3 cm in length.  No bleeding noted.  No significant swelling.  Tenderness to palpation overlying laceration.  Full range of motion of knee present.  No crepitus noted.  Capillary refill and pulses intact.  Neurological:     General: No focal deficit present.     Mental Status: She is alert and oriented to person, place, and time. Mental status is at baseline.  Psychiatric:        Mood and Affect: Mood normal.        Behavior: Behavior normal.      UC Treatments / Results  Labs (all labs ordered are listed, but only abnormal results are displayed) Labs Reviewed - No data to display  EKG   Radiology DG Knee AP/LAT W/[place] Left  Result Date: 10/21/2023 CLINICAL DATA:  Fall, knee pain EXAM: LEFT KNEE 3 VIEWS COMPARISON:  None Available. FINDINGS: No fracture or dislocation is seen. The joint spaces are preserved. The visualized soft tissues are unremarkable. No suprapatellar knee joint effusion. IMPRESSION:  Negative. Electronically Signed   By: Charline Bills M.D.   On: 10/21/2023 20:23   DG Chest 2 View  Result Date: 10/21/2023 CLINICAL  DATA:  Cough EXAM: CHEST - 2 VIEW COMPARISON:  09/16/2016 FINDINGS: Mild patchy bilateral lower lobe opacities, suspicious for pneumonia. No pleural effusions on the lateral view. No pneumothorax. The heart is normal in size. Visualized osseous structures are within normal limits. IMPRESSION: Mild patchy bilateral lower lobe opacities, suspicious for pneumonia. Electronically Signed   By: Charline Bills M.D.   On: 10/21/2023 20:22    Procedures Procedures (including critical care time)  Medications Ordered in UC Medications - No data to display  Initial Impression / Assessment and Plan / UC Course  I have reviewed the triage vital signs and the nursing notes.  Pertinent labs & imaging results that were available during my care of the patient were reviewed by me and considered in my medical decision making (see chart for details).     Persistent cough  Chest x-ray completed that is suspicious of pneumonia bilaterally.  Patient was sent azithromycin on discharge.  Will send additional antibiotic therapy of amoxicillin as well.  See telephone note.  Patient was also prescribed prednisone given concern for mild asthma flareup due to this as well as a cough medication.  Attempted to call patient to notify of x-ray results but number was not in service.  Called patient's mother to advise her to have patient call back per privacy policy. patient will need to repeat x-ray in 4 to 6 weeks to ensure infection is clear.  2.  Laceration of left knee  Laceration was repaired with derma clips with good approximation of wound edges.  Did not think sutures were necessary especially given it had been about 24 hours since wound occurred.  Wound was cleaned prior to derma clips being applied.  Dressing also applied.  Advised patient to monitor for signs of infection and to follow-up sooner if they occur.  Also advised to return in 7 days or sooner to have Derma clips removed.  Tetanus vaccine is up-to-date  within 5 years per patient report.  Left knee x-ray was negative for any acute bony abnormality.  Advised strict follow-up precautions.  Patient verbalized understanding and was agreeable with plan. Final Clinical Impressions(s) / UC Diagnoses   Final diagnoses:  Persistent cough  Fall, initial encounter  Acute pain of left knee  Laceration of left knee, initial encounter     Discharge Instructions      I will call if x-ray results are abnormal.  I have prescribed you antibiotic, cough medication, prednisone for persistent cough.  Monitor knee laceration for increased redness, swelling, pus and follow-up this occurs.  You may follow-up in about a week to have bandages removed.    ED Prescriptions     Medication Sig Dispense Auth. Provider   predniSONE (DELTASONE) 20 MG tablet Take 2 tablets (40 mg total) by mouth daily for 5 days. 10 tablet [place], [place] E, [place]   benzonatate (TESSALON) 100 MG capsule Take 1 capsule (100 mg total) by mouth every 8 (eight) hours as needed for cough. 21 capsule [place], [place] E, [place]   azithromycin (ZITHROMAX Z-PAK) 250 MG tablet Take 2 tablets (500 mg total) by mouth daily for 1 day, THEN 1 tablet (250 mg total) daily for 4 days. 6 tablet [place], Acie Fredrickson, [place]      PDMP not reviewed this encounter.   Gustavus Bryant, [place] 10/22/23 [phone]

## 2023-10-21 NOTE — ED Notes (Addendum)
Copied from [facility] records in Care Everywhere:  Assessment & Plan (05/22/2019 2:50 PM EDT):   Formatting of this note might be different from the original. 1-hr GTT, CBC, HIV and Syphilis screen TDaP today [facility] + Wrote letter stating that it is okay to go back to work with proper PPE

## 2023-10-21 NOTE — ED Triage Notes (Signed)
Pt states she had an appointment to come in to be seen for cough that has been on going x 1-2 weeks, productive for mucous. She had a fever last weekend. Last night she was out trick-or-treating with her children and she fell on rocky ground and landed on her left knee. Fall occurred around 8pm. Semi-circle lac present to left knee approx 3cm x 0.5cm wide. No active bleeding. Unsure of last tetanus

## 2023-10-21 NOTE — Discharge Instructions (Addendum)
I will call if x-ray results are abnormal.  I have prescribed you antibiotic, cough medication, prednisone for persistent cough.  Monitor knee laceration for increased redness, swelling, pus and follow-up this occurs.  You may follow-up in about a week to have bandages removed.

## 2023-10-22 ENCOUNTER — Telehealth: Payer: Self-pay | Admitting: Internal Medicine

## 2023-10-22 MED ORDER — AMOXICILLIN 500 MG PO CAPS: 1000.0000 mg | ORAL_CAPSULE | Freq: Three times a day (TID) | ORAL | 0 refills | Status: AC

## 2023-10-22 NOTE — Telephone Encounter (Signed)
Patient came into urgent care today to discuss x-ray results in person.  Discussed pneumonia noted on x-ray.  Advised patient I did send her second antibiotic to start taking immediately.  Also advised to return to PCP or urgent care in 4 to 6 weeks to have repeat chest x-ray.  She was also given strict return and ER precautions.  Patient verbalized understanding and was agreeable with plan.

## 2023-10-22 NOTE — Telephone Encounter (Signed)
Patient's chest x-ray showing concern for bilateral pneumonia.  Patient was sent azithromycin on discharge so will add amoxicillin.  Attempted to call patient's phone number but it was "out of service".  Called patient's mother who was listed as emergency contact to have her call us back per privacy policy.  Patient will need to return in 4 to 6 weeks to have repeat chest x-ray with PCP or urgent care.

## 2024-07-16 ENCOUNTER — Ambulatory Visit: Admission: EM | Admit: 2024-07-16 | Discharge: 2024-07-16 | Disposition: A | Discharge status: Home / Self Care

## 2024-07-16 DIAGNOSIS — R21 Rash and other nonspecific skin eruption: Secondary | ICD-10-CM | POA: Diagnosis not present

## 2024-07-16 MED ORDER — TRIAMCINOLONE ACETONIDE 40 MG/ML IJ SUSP
40.0000 mg | Freq: Once | INTRAMUSCULAR | Status: AC
  Administered 2024-07-16: 40 mg via INTRAMUSCULAR

## 2024-07-16 NOTE — ED Provider Notes (Signed)
 Danielle Gibbs    CSN: [id number] Arrival date & time: 07/16/24  1258      History   Chief Complaint Chief Complaint  Patient presents with   Rash    HPI Danielle Gibbs is a 28 y.o. female.   Patient here today for evaluation of rash to her face that started when she arrived to work. She thinks she might be having a reaction to something a co-worker sprayed but is unsure what this was. She notes that she had tingling in her face initially and when she went to the bathroom she noted some redness and bumps to the area of tingling. She noted a tickle in her throat and took cetirizine  which helped. She has not had any shortness of breath. She continues to have itching to her face.   The history is provided by the patient.  Rash Associated symptoms: no abdominal pain, no fever, no nausea, no shortness of breath and not vomiting     Past Medical History:  Diagnosis Date   Eczema     Patient Active Problem List   Diagnosis Date Noted   Stress incontinence in female 02/02/2023   Anxiety 03/17/2022   Encounter for initial prescription of contraceptive pills 08/19/2021   HSV (herpes simplex virus) anogenital infection 12/30/2020   Postpartum Gibbs and examination 12/30/2020   Axillary hidradenitis suppurativa 04/29/2020   Anemia 09/08/2017    History reviewed. No pertinent surgical history.  OB History     Gravida  4   Para  3   Term  2   Preterm  1   AB  1   Living  3      SAB      IAB  1   Ectopic      Multiple      Live Births  3            Home Medications    Prior to Admission medications   Medication Sig Start Date End Date Taking? Authorizing Provider  cetirizine  (ZYRTEC ) 10 MG chewable tablet Chew 10 mg by mouth daily.   Yes [provider]  clobetasol cream (TEMOVATE) 0.05 % Apply 1 Application topically 2 (two) times daily. 04/07/22  Yes [provider]  clobetasol ointment (TEMOVATE) 0.05 % Apply 1 Application  topically 2 (two) times daily. 09/26/20  Yes [provider]  HYDROcodone-acetaminophen  (NORCO/VICODIN) 5-325 MG tablet Take 1-2 tablets by mouth every 6 (six) hours as needed. 12/05/20  Yes [provider]  Levonorgestrel (SKYLA) 13.5 MG IUD by Intrauterine route.   Yes [provider]  methylergonovine (METHERGINE) 0.2 MG/ML injection Inject 0.2 mg into the vein once. 11/16/21  Yes [provider]  misoprostol (CYTOTEC) 200 MCG tablet Place rectally. 11/16/21  Yes [provider]  norethindrone-ethinyl estradiol-iron (LOESTRIN FE) 1.5-30 MG-MCG tablet Take 1 tablet by mouth daily. 11/01/22  Yes [provider]  triamcinolone  cream (KENALOG ) 0.1 % Apply 1 Application topically 2 (two) times daily. 04/07/22  Yes [provider]  acetaminophen  (TYLENOL ) 500 MG tablet Take 500 mg by mouth every 6 (six) hours as needed.    [provider]  Ferrous Sulfate (IRON PO) Take 1 tablet by mouth daily.    [provider]  sertraline (ZOLOFT) 25 MG tablet Take 1 tablet by mouth daily. 02/02/23 02/02/24  [provider]    Family History Family History  Problem Relation Age of Onset   Healthy Mother    Healthy Father  Social History Social History   Tobacco Use   Smoking status: Never    Passive exposure: Never   Smokeless tobacco: Never  Vaping Use   Vaping status: Never Used  Substance Use Topics   Alcohol use: No   Drug use: No     Allergies   Cranberry and Justicia adhatoda   Review of Systems Review of Systems  Constitutional:  Negative for chills and fever.  HENT:  Negative for facial swelling and trouble swallowing.   Eyes:  Negative for discharge and redness.  Respiratory:  Negative for shortness of breath.   Gastrointestinal:  Negative for abdominal pain, nausea and vomiting.  Skin:  Positive for rash.     Physical Exam Triage Vital Signs ED Triage Vitals  Encounter Vitals Group      BP 07/16/24 1330 124/85     Girls Systolic BP Percentile --      Girls Diastolic BP Percentile --      Boys Systolic BP Percentile --      Boys Diastolic BP Percentile --      Pulse Rate 07/16/24 1330 78     Resp 07/16/24 1330 18     Temp 07/16/24 1330 98 F (36.7 C)     Temp Source 07/16/24 1330 Oral     SpO2 07/16/24 1330 97 %     Weight 07/16/24 1327 175 lb (79.4 kg)     Height 07/16/24 1327 5' 7 (1.702 m)     Head Circumference --      Peak Flow --      Pain Score 07/16/24 1325 0     Pain Loc --      Pain Education --      Exclude from Growth Chart --    No data found.  Updated Vital Signs BP 124/85 (BP Location: Left Arm)   Pulse 78   Temp 98 F (36.7 C) (Oral)   Resp 18   Ht 5' 7 (1.702 m)   Wt 175 lb (79.4 kg)   LMP  (LMP Unknown)   SpO2 97%   BMI 27.41 kg/m   Visual Acuity Right Eye Distance:   Left Eye Distance:   Bilateral Distance:    Right Eye Near:   Left Eye Near:    Bilateral Near:     Physical Exam Vitals and nursing note reviewed.  Constitutional:      General: She is not in acute distress.    Appearance: Normal appearance. She is not ill-appearing.  HENT:     Head: Normocephalic and atraumatic.     Nose: Nose normal. No congestion or rhinorrhea.     Mouth/Throat:     Mouth: Mucous membranes are moist.     Pharynx: Oropharynx is clear. No oropharyngeal exudate or posterior oropharyngeal erythema.  Eyes:     Conjunctiva/sclera: Conjunctivae normal.  Cardiovascular:     Rate and Rhythm: Normal rate and regular rhythm.  Pulmonary:     Effort: Pulmonary effort is normal. No respiratory distress.     Breath sounds: Normal breath sounds. No stridor. No wheezing, rhonchi or rales.  Skin:    Comments: See photo- minimal sandpaper like bumps to perioral area, nasal folds  Neurological:     Mental Status: She is alert.  Psychiatric:        Mood and Affect: Mood normal.        Behavior: Behavior normal.        Thought Content: Thought  content normal.  UC Treatments / Results  Labs (all labs ordered are listed, but only abnormal results are displayed) Labs Reviewed - No data to display  EKG   Radiology No results found.  Procedures Procedures (including critical Gibbs time)  Medications Ordered in UC Medications  triamcinolone  acetonide (KENALOG -40) injection 40 mg (40 mg Intramuscular Given 07/16/24 1443)    Initial Impression / Assessment and Plan / UC Course  I have reviewed the triage vital signs and the nursing notes.  Pertinent labs & imaging results that were available during my Gibbs of the patient were reviewed by me and considered in my medical decision making (see chart for details).    Will treat to cover possible reaction with steroid injection. Encouraged follow up if no gradual improvement or ED with any worsening.   Final Clinical Impressions(s) / UC Diagnoses   Final diagnoses:  Facial rash   Discharge Instructions   None    ED Prescriptions   None    PDMP not reviewed this encounter.   Billy Asberry FALCON, PA-C 07/16/24 1504

## 2024-07-16 NOTE — ED Triage Notes (Signed)
 My face is breaking out in hives, this started around 1130 (real bad) but on/off after getting to work this was flaring up, the only thing new I have been exposed to is something someone sprayed at work. No sob. No wheezing. No respiratory distress.

## 2024-09-03 ENCOUNTER — Encounter: Payer: Self-pay | Admitting: *Deleted

## 2024-09-03 ENCOUNTER — Other Ambulatory Visit: Payer: Self-pay

## 2024-09-03 ENCOUNTER — Ambulatory Visit
Admission: EM | Admit: 2024-09-03 | Discharge: 2024-09-03 | Disposition: A | Discharge status: Home / Self Care | Attending: Nurse Practitioner | Admitting: Nurse Practitioner

## 2024-09-03 DIAGNOSIS — J209 Acute bronchitis, unspecified: Secondary | ICD-10-CM | POA: Diagnosis not present

## 2024-09-03 MED ORDER — MUCINEX DM MAXIMUM STRENGTH 60-1200 MG PO TB12: 1.0000 | ORAL_TABLET | Freq: Two times a day (BID) | ORAL | 0 refills | Status: AC

## 2024-09-03 MED ORDER — PREDNISONE 20 MG PO TABS: 40.0000 mg | ORAL_TABLET | Freq: Every day | ORAL | 0 refills | Status: AC

## 2024-09-03 MED ORDER — AZITHROMYCIN 500 MG PO TABS: 500.0000 mg | ORAL_TABLET | Freq: Every day | ORAL | 0 refills | Status: AC

## 2024-09-03 MED ORDER — PROMETHAZINE-DM 6.25-15 MG/5ML PO SYRP: 10.0000 mL | ORAL_SOLUTION | Freq: Four times a day (QID) | ORAL | 0 refills | Status: AC | PRN

## 2024-09-03 MED ORDER — IPRATROPIUM-ALBUTEROL 0.5-2.5 (3) MG/3ML IN SOLN
3.0000 mL | Freq: Once | RESPIRATORY_TRACT | Status: AC
  Administered 2024-09-03: 3 mL via RESPIRATORY_TRACT

## 2024-09-03 NOTE — Discharge Instructions (Signed)
 You have been diagnosed with acute bronchitis, which is a sudden inflammation of the large airways in your lungs. This inflammation causes the airways to narrow and produce more mucus, making it harder to breathe and leading to coughing. Acute bronchitis is most often caused by the same viruses that cause the common cold.  Take the medications that were prescribed to you as directed. If you have a fever, headache, or body aches, you can also take Tylenol or ibuprofen to help you feel more comfortable. Be sure to drink plenty of fluids to stay hydrated--aim for enough to keep your urine a pale yellow color. This will also help to thin mucus and make it easier to clear from your body. The prescribed cough medicine should help loosen mucus, relieve congestion, and ease your cough. Taking two teaspoons of honey at bedtime may also help reduce nighttime coughing. Avoid using any nicotine or tobacco products, as they can worsen your symptoms and delay healing.   Using a cool mist humidifier at home to keep humidity levels above 50% can be helpful. You can also inhale steam for 10 to 15 minutes, 3 to 4 times a day. This can be done by sitting in the bathroom with a hot shower running, or by using over-the-counter vapor shower tablets to help with nasal congestion. Try to avoid cool or dry air as much as possible. Be sure to get enough rest every night to support your recovery. Don't forget to replace your toothbrush once you start feeling better.   It's normal for a cough to linger for several weeks after a respiratory illness, even after other symptoms have resolved. This happens because the airways remain irritated and take time to fully heal. As long as the cough gradually improves and there are no new concerning symptoms, this is part of the normal recovery process.  Seek emergency care right away if you cough up blood, feel chest pain, have severe shortness of breath, faint or feel like you might faint,  develop a severe headache, or experience worsening fever or chills.

## 2024-09-03 NOTE — ED Provider Notes (Addendum)
 [name]    CSN: [id number] Arrival date & time: 09/03/24  0912      History   Chief Complaint Chief Complaint  Patient presents with   Cough    HPI Danielle Gibbs is a 28 y.o. female.   Discussed the use of AI scribe software for clinical note transcription with the patient, who gave verbal consent to proceed.   The patient presents with multiple symptoms of an upper respiratory infection, including chest pain, cough, fever, sweating, sneezing, sore throat, productive cough, shortness of breath, and nausea, which began approximately 13 days ago. The patient reports catching the illness from her son, who became ill about 2 days prior to the patient's symptom onset.  The patient describes the cough as persistent, stating I can't get rid of this cough. They report chest pain, particularly when taking deep breaths, describing it as a tight feeling in the chest. The patient has been experiencing fevers and sweating, though the exact temperature was not measured. The cough is productive. Some shortness of breath is also reported, but no wheezing or vomiting.   The following sections of the patient's history were reviewed and updated as appropriate: allergies, current medications, past family history, past medical history, past social history, past surgical history, and problem list.         Past Medical History:  Diagnosis Date   Eczema     Patient Active Problem List   Diagnosis Date Noted   Stress incontinence in female 02/02/2023   Anxiety 03/17/2022   Encounter for initial prescription of contraceptive pills 08/19/2021   HSV (herpes simplex virus) anogenital infection 12/30/2020   Postpartum care and examination 12/30/2020   Axillary hidradenitis suppurativa 04/29/2020   Anemia 09/08/2017    History reviewed. No pertinent surgical history.  OB History     Gravida  4   Para  3   Term  2   Preterm  1   AB  1   Living  3      SAB       IAB  1   Ectopic      Multiple      Live Births  3            Home Medications    Prior to Admission medications   Medication Sig Start Date End Date Taking? Authorizing Provider  acetaminophen  (TYLENOL ) 500 MG tablet Take 500 mg by mouth every 6 (six) hours as needed.   Yes [provider]  azithromycin  (ZITHROMAX ) 500 MG tablet Take 1 tablet (500 mg total) by mouth daily for 5 days. 09/03/24 09/08/24 Yes Iola Lukes, FNP  cetirizine  (ZYRTEC ) 10 MG chewable tablet Chew 10 mg by mouth daily.   Yes [provider]  clobetasol cream (TEMOVATE) 0.05 % Apply 1 Application topically 2 (two) times daily. 04/07/22  Yes [provider]  clobetasol ointment (TEMOVATE) 0.05 % Apply 1 Application topically 2 (two) times daily. 09/26/20  Yes [provider]  Dextromethorphan-guaiFENesin  (MUCINEX  DM MAXIMUM STRENGTH) 60-1200 MG TB12 Take 1 tablet by mouth 2 (two) times daily. 09/03/24  Yes Iola Lukes, FNP  Ferrous Sulfate (IRON PO) Take 1 tablet by mouth daily.   Yes [provider]  Levonorgestrel (SKYLA) 13.5 MG IUD by Intrauterine route.   Yes [provider]  norethindrone-ethinyl estradiol-iron (LOESTRIN FE) 1.5-30 MG-MCG tablet Take 1 tablet by mouth daily. 11/01/22  Yes [provider]  predniSONE  (DELTASONE ) 20 MG tablet Take 2 tablets (40 mg  total) by mouth daily for 5 days. 09/03/24 09/08/24 Yes Kamar Callender, FNP  promethazine -dextromethorphan (PROMETHAZINE -DM) 6.25-15 MG/5ML syrup Take 10 mLs by mouth every 6 (six) hours as needed for cough. 09/03/24  Yes Iola Lukes, FNP  triamcinolone  cream (KENALOG ) 0.1 % Apply 1 Application topically 2 (two) times daily. 04/07/22  Yes [provider]  HYDROcodone-acetaminophen  (NORCO/VICODIN) 5-325 MG tablet Take 1-2 tablets by mouth every 6 (six) hours as needed. Patient not taking: Reported on 09/03/2024 12/05/20   [provider]  methylergonovine  (METHERGINE) 0.2 MG/ML injection Inject 0.2 mg into the vein once. 11/16/21   [provider]  misoprostol (CYTOTEC) 200 MCG tablet Place rectally. 11/16/21   [provider]  sertraline (ZOLOFT) 25 MG tablet Take 1 tablet by mouth daily. 02/02/23 02/02/24  [provider]    Family History Family History  Problem Relation Age of Onset   Healthy Mother    Healthy Father     Social History Social History   Tobacco Use   Smoking status: Never    Passive exposure: Never   Smokeless tobacco: Never  Vaping Use   Vaping status: Never Used  Substance Use Topics   Alcohol use: No   Drug use: No     Allergies   Cranberry and Justicia adhatoda   Review of Systems Review of Systems  Constitutional:  Positive for diaphoresis and fever (subjective).  HENT:  Positive for congestion, rhinorrhea, sinus pressure and sneezing.   Respiratory:  Positive for cough (productive), chest tightness (especially when taking deep breaths) and shortness of breath. Negative for wheezing.   Gastrointestinal:  Positive for nausea. Negative for diarrhea and vomiting.  Neurological:  Positive for headaches.  All other systems reviewed and are negative.    Physical Exam Triage Vital Signs ED Triage Vitals  Encounter Vitals Group     BP 09/03/24 0953 115/80     Girls Systolic BP Percentile --      Girls Diastolic BP Percentile --      Boys Systolic BP Percentile --      Boys Diastolic BP Percentile --      Pulse Rate 09/03/24 0953 80     Resp 09/03/24 0953 18     Temp 09/03/24 0953 98.4 F (36.9 C)     Temp Source 09/03/24 0953 Oral     SpO2 09/03/24 0953 98 %     Weight --      Height --      Head Circumference --      Peak Flow --      Pain Score 09/03/24 0950 4     Pain Loc --      Pain Education --      Exclude from Growth Chart --    No data found.  Updated Vital Signs BP 115/80 (BP Location: Left Arm)   Pulse 80   Temp 98.4 F (36.9 C) (Oral)   Resp  18   SpO2 98%   Visual Acuity Right Eye Distance:   Left Eye Distance:   Bilateral Distance:    Right Eye Near:   Left Eye Near:    Bilateral Near:     Physical Exam Vitals reviewed.  Constitutional:      General: She is awake. She is not in acute distress.    Appearance: Normal appearance. She is well-developed. She is not ill-appearing, toxic-appearing or diaphoretic.  HENT:     Head: Normocephalic.     Right Ear: Hearing, tympanic membrane, ear  canal and external ear normal. No drainage, swelling or tenderness. No middle ear effusion. Tympanic membrane is not erythematous.     Left Ear: Hearing, tympanic membrane, ear canal and external ear normal. No drainage, swelling or tenderness.  No middle ear effusion. Tympanic membrane is not erythematous.     Nose: Congestion present. No rhinorrhea.     Right Sinus: No maxillary sinus tenderness or frontal sinus tenderness.     Left Sinus: No maxillary sinus tenderness or frontal sinus tenderness.     Mouth/Throat:     Lips: Pink.     Mouth: Mucous membranes are moist.     Pharynx: Oropharynx is clear. Uvula midline. No pharyngeal swelling, oropharyngeal exudate, posterior oropharyngeal erythema or uvula swelling.     Tonsils: No tonsillar exudate or tonsillar abscesses.  Eyes:     General: Vision grossly intact.     Conjunctiva/sclera: Conjunctivae normal.  Cardiovascular:     Rate and Rhythm: Normal rate and regular rhythm.     Heart sounds: Normal heart sounds.  Pulmonary:     Effort: Pulmonary effort is normal.     Breath sounds: Normal breath sounds and air entry.     Comments: Respirations even and unlabored  Musculoskeletal:        General: Normal range of motion.     Cervical back: Full passive range of motion without pain, normal range of motion and neck supple.  Lymphadenopathy:     Cervical: No cervical adenopathy.  Skin:    General: Skin is warm and dry.  Neurological:     General: No focal deficit present.      Mental Status: She is alert and oriented to person, place, and time.  Psychiatric:        Mood and Affect: Mood normal.        Speech: Speech normal.        Behavior: Behavior normal. Behavior is cooperative.      UC Treatments / Results  Labs (all labs ordered are listed, but only abnormal results are displayed) Labs Reviewed - No data to display  EKG   Radiology No results found.  Procedures Procedures (including critical care time)  Medications Ordered in UC Medications  ipratropium-albuterol  (DUONEB) 0.5-2.5 (3) MG/3ML nebulizer solution 3 mL (3 mLs Nebulization Given 09/03/24 1131)    Initial Impression / Assessment and Plan / UC Course  I have reviewed the triage vital signs and the nursing notes.  Pertinent labs & imaging results that were available during my care of the patient were reviewed by me and considered in my medical decision making (see chart for details).     The patient presents with cough and other upper respiratory symptoms for the past  13 days. In clinic, chest tightness improved following a Duoneb treatment. The patient is afebrile, nontoxic, with even and unlabored respirations and no acute distress. Clinical impression is acute bronchitis. Treatment initiated with oral antibiotics, a short course of steroids, Mucinex  DM twice daily, and Promethazine  DM as needed. Supportive measures including rest, hydration were advised. Patient instructed to follow up with PCP if symptoms do not improve within several days and to seek emergency care for worsening shortness of breath, chest pain, persistent high fever, or other concerning symptoms.  Today's evaluation has revealed no signs of a dangerous process. Discussed diagnosis with patient and/or guardian. Patient and/or guardian aware of their diagnosis, possible red flag symptoms to watch out for and need for close follow up. Patient and/or guardian understands  verbal and written discharge instructions. Patient  and/or guardian comfortable with plan and disposition.  Patient and/or guardian has a clear mental status at this time, good insight into illness (after discussion and teaching) and has clear judgment to make decisions regarding their care  Documentation was completed with the aid of voice recognition software. Transcription may contain typographical errors. Final Clinical Impressions(s) / UC Diagnoses   Final diagnoses:  Acute bronchitis, unspecified organism     Discharge Instructions      You have been diagnosed with acute bronchitis, which is a sudden inflammation of the large airways in your lungs. This inflammation causes the airways to narrow and produce more mucus, making it harder to breathe and leading to coughing. Acute bronchitis is most often caused by the same viruses that cause the common cold.  Take the medications that were prescribed to you as directed. If you have a fever, headache, or body aches, you can also take Tylenol  or ibuprofen  to help you feel more comfortable. Be sure to drink plenty of fluids to stay hydrated--aim for enough to keep your urine a pale yellow color. This will also help to thin mucus and make it easier to clear from your body. The prescribed cough medicine should help loosen mucus, relieve congestion, and ease your cough. Taking two teaspoons of honey at bedtime may also help reduce nighttime coughing. Avoid using any nicotine or tobacco products, as they can worsen your symptoms and delay healing.   Using a cool mist humidifier at home to keep humidity levels above 50% can be helpful. You can also inhale steam for 10 to 15 minutes, 3 to 4 times a day. This can be done by sitting in the bathroom with a hot shower running, or by using over-the-counter vapor shower tablets to help with nasal congestion. Try to avoid cool or dry air as much as possible. Be sure to get enough rest every night to support your recovery. Don't forget to replace your toothbrush  once you start feeling better.   It's normal for a cough to linger for several weeks after a respiratory illness, even after other symptoms have resolved. This happens because the airways remain irritated and take time to fully heal. As long as the cough gradually improves and there are no new concerning symptoms, this is part of the normal recovery process.  Seek emergency care right away if you cough up blood, feel chest pain, have severe shortness of breath, faint or feel like you might faint, develop a severe headache, or experience worsening fever or chills.       ED Prescriptions     Medication Sig Dispense Auth. Provider   promethazine -dextromethorphan (PROMETHAZINE -DM) 6.25-15 MG/5ML syrup Take 10 mLs by mouth every 6 (six) hours as needed for cough. 118 mL Iola Lukes, FNP   predniSONE  (DELTASONE ) 20 MG tablet Take 2 tablets (40 mg total) by mouth daily for 5 days. 10 tablet Iola Lukes, FNP   Dextromethorphan-guaiFENesin  (MUCINEX  DM MAXIMUM STRENGTH) 60-1200 MG TB12 Take 1 tablet by mouth 2 (two) times daily. 20 tablet Iola Lukes, FNP   azithromycin  (ZITHROMAX ) 500 MG tablet Take 1 tablet (500 mg total) by mouth daily for 5 days. 5 tablet Iola Lukes, FNP      PDMP not reviewed this encounter.   Iola Lukes, FNP 09/03/24 1203    Iola Lukes, [place] 09/03/24 1205

## 2024-09-03 NOTE — ED Triage Notes (Signed)
 Pt reports 2 weeks cough, runny nose, congestion, subjective fever. She has been taking tylenol  cold and flu. Her children have been sick
